# Patient Record
Sex: Female | Born: 1960 | Race: Black or African American | Hispanic: No | Marital: Married | Smoking: Never smoker
Health system: Southern US, Community
[De-identification: ages and names within clinical notes are randomized; demographics above are authoritative.]

## PROBLEM LIST (undated history)

## (undated) HISTORY — PX: ABDOMINAL HYSTERECTOMY: SHX81

## (undated) HISTORY — PX: CYST REMOVAL NECK: SHX6281

---

## 1999-07-11 ENCOUNTER — Other Ambulatory Visit: Admission: RE | Admit: 1999-07-11 | Discharge: 1999-07-11 | Payer: Self-pay | Admitting: Obstetrics and Gynecology

## 1999-07-20 ENCOUNTER — Ambulatory Visit (HOSPITAL_COMMUNITY): Admission: RE | Admit: 1999-07-20 | Discharge: 1999-07-20 | Payer: Self-pay | Admitting: Obstetrics and Gynecology

## 1999-07-20 ENCOUNTER — Encounter: Payer: Self-pay | Admitting: Obstetrics and Gynecology

## 2002-03-10 ENCOUNTER — Other Ambulatory Visit: Admission: RE | Admit: 2002-03-10 | Discharge: 2002-03-10 | Payer: Self-pay | Admitting: Family Medicine

## 2002-04-08 ENCOUNTER — Encounter: Payer: Self-pay | Admitting: Obstetrics

## 2002-04-08 ENCOUNTER — Ambulatory Visit (HOSPITAL_COMMUNITY): Admission: RE | Admit: 2002-04-08 | Discharge: 2002-04-08 | Payer: Self-pay | Admitting: Obstetrics

## 2002-10-15 ENCOUNTER — Inpatient Hospital Stay (HOSPITAL_COMMUNITY): Admission: AD | Admit: 2002-10-15 | Discharge: 2002-10-17 | Payer: Self-pay | Admitting: Obstetrics

## 2004-08-21 ENCOUNTER — Encounter: Admission: RE | Admit: 2004-08-21 | Discharge: 2004-08-21 | Payer: Self-pay | Admitting: Family Medicine

## 2004-11-02 ENCOUNTER — Ambulatory Visit (HOSPITAL_COMMUNITY): Admission: RE | Admit: 2004-11-02 | Discharge: 2004-11-03 | Payer: Self-pay | Admitting: Otolaryngology

## 2005-10-26 ENCOUNTER — Ambulatory Visit (HOSPITAL_COMMUNITY): Admission: RE | Admit: 2005-10-26 | Discharge: 2005-10-26 | Payer: Self-pay | Admitting: Family Medicine

## 2015-09-09 DIAGNOSIS — N951 Menopausal and female climacteric states: Secondary | ICD-10-CM | POA: Diagnosis not present

## 2015-09-09 DIAGNOSIS — R635 Abnormal weight gain: Secondary | ICD-10-CM | POA: Diagnosis not present

## 2015-09-15 DIAGNOSIS — E538 Deficiency of other specified B group vitamins: Secondary | ICD-10-CM | POA: Diagnosis not present

## 2015-09-15 DIAGNOSIS — E6609 Other obesity due to excess calories: Secondary | ICD-10-CM | POA: Diagnosis not present

## 2015-09-15 DIAGNOSIS — N958 Other specified menopausal and perimenopausal disorders: Secondary | ICD-10-CM | POA: Diagnosis not present

## 2015-09-15 DIAGNOSIS — R7303 Prediabetes: Secondary | ICD-10-CM | POA: Diagnosis not present

## 2015-09-15 DIAGNOSIS — E782 Mixed hyperlipidemia: Secondary | ICD-10-CM | POA: Diagnosis not present

## 2015-09-23 DIAGNOSIS — R7303 Prediabetes: Secondary | ICD-10-CM | POA: Diagnosis not present

## 2015-09-23 DIAGNOSIS — E6609 Other obesity due to excess calories: Secondary | ICD-10-CM | POA: Diagnosis not present

## 2015-09-23 DIAGNOSIS — E538 Deficiency of other specified B group vitamins: Secondary | ICD-10-CM | POA: Diagnosis not present

## 2015-09-23 DIAGNOSIS — E782 Mixed hyperlipidemia: Secondary | ICD-10-CM | POA: Diagnosis not present

## 2015-09-29 DIAGNOSIS — E6609 Other obesity due to excess calories: Secondary | ICD-10-CM | POA: Diagnosis not present

## 2015-09-29 DIAGNOSIS — E782 Mixed hyperlipidemia: Secondary | ICD-10-CM | POA: Diagnosis not present

## 2015-09-29 DIAGNOSIS — R7303 Prediabetes: Secondary | ICD-10-CM | POA: Diagnosis not present

## 2015-09-29 DIAGNOSIS — E538 Deficiency of other specified B group vitamins: Secondary | ICD-10-CM | POA: Diagnosis not present

## 2015-10-07 DIAGNOSIS — R7303 Prediabetes: Secondary | ICD-10-CM | POA: Diagnosis not present

## 2015-10-07 DIAGNOSIS — E6609 Other obesity due to excess calories: Secondary | ICD-10-CM | POA: Diagnosis not present

## 2015-10-07 DIAGNOSIS — E782 Mixed hyperlipidemia: Secondary | ICD-10-CM | POA: Diagnosis not present

## 2015-10-07 DIAGNOSIS — E538 Deficiency of other specified B group vitamins: Secondary | ICD-10-CM | POA: Diagnosis not present

## 2015-10-13 DIAGNOSIS — E6609 Other obesity due to excess calories: Secondary | ICD-10-CM | POA: Diagnosis not present

## 2015-10-13 DIAGNOSIS — E782 Mixed hyperlipidemia: Secondary | ICD-10-CM | POA: Diagnosis not present

## 2015-10-13 DIAGNOSIS — E538 Deficiency of other specified B group vitamins: Secondary | ICD-10-CM | POA: Diagnosis not present

## 2015-10-13 DIAGNOSIS — R7303 Prediabetes: Secondary | ICD-10-CM | POA: Diagnosis not present

## 2015-10-21 DIAGNOSIS — E538 Deficiency of other specified B group vitamins: Secondary | ICD-10-CM | POA: Diagnosis not present

## 2015-10-21 DIAGNOSIS — E782 Mixed hyperlipidemia: Secondary | ICD-10-CM | POA: Diagnosis not present

## 2015-10-21 DIAGNOSIS — E6609 Other obesity due to excess calories: Secondary | ICD-10-CM | POA: Diagnosis not present

## 2015-10-21 DIAGNOSIS — R7303 Prediabetes: Secondary | ICD-10-CM | POA: Diagnosis not present

## 2015-10-27 DIAGNOSIS — E782 Mixed hyperlipidemia: Secondary | ICD-10-CM | POA: Diagnosis not present

## 2015-10-27 DIAGNOSIS — E538 Deficiency of other specified B group vitamins: Secondary | ICD-10-CM | POA: Diagnosis not present

## 2015-10-27 DIAGNOSIS — R7303 Prediabetes: Secondary | ICD-10-CM | POA: Diagnosis not present

## 2015-10-27 DIAGNOSIS — E6609 Other obesity due to excess calories: Secondary | ICD-10-CM | POA: Diagnosis not present

## 2015-11-03 DIAGNOSIS — E782 Mixed hyperlipidemia: Secondary | ICD-10-CM | POA: Diagnosis not present

## 2015-11-03 DIAGNOSIS — E538 Deficiency of other specified B group vitamins: Secondary | ICD-10-CM | POA: Diagnosis not present

## 2015-11-03 DIAGNOSIS — R7303 Prediabetes: Secondary | ICD-10-CM | POA: Diagnosis not present

## 2015-11-03 DIAGNOSIS — E6609 Other obesity due to excess calories: Secondary | ICD-10-CM | POA: Diagnosis not present

## 2015-11-11 DIAGNOSIS — R7303 Prediabetes: Secondary | ICD-10-CM | POA: Diagnosis not present

## 2015-11-11 DIAGNOSIS — E782 Mixed hyperlipidemia: Secondary | ICD-10-CM | POA: Diagnosis not present

## 2015-11-11 DIAGNOSIS — E538 Deficiency of other specified B group vitamins: Secondary | ICD-10-CM | POA: Diagnosis not present

## 2015-11-11 DIAGNOSIS — E6609 Other obesity due to excess calories: Secondary | ICD-10-CM | POA: Diagnosis not present

## 2015-11-18 DIAGNOSIS — E782 Mixed hyperlipidemia: Secondary | ICD-10-CM | POA: Diagnosis not present

## 2015-11-18 DIAGNOSIS — E538 Deficiency of other specified B group vitamins: Secondary | ICD-10-CM | POA: Diagnosis not present

## 2015-11-18 DIAGNOSIS — E6609 Other obesity due to excess calories: Secondary | ICD-10-CM | POA: Diagnosis not present

## 2015-11-24 DIAGNOSIS — R7303 Prediabetes: Secondary | ICD-10-CM | POA: Diagnosis not present

## 2015-11-24 DIAGNOSIS — E6609 Other obesity due to excess calories: Secondary | ICD-10-CM | POA: Diagnosis not present

## 2015-11-24 DIAGNOSIS — E782 Mixed hyperlipidemia: Secondary | ICD-10-CM | POA: Diagnosis not present

## 2015-11-24 DIAGNOSIS — E538 Deficiency of other specified B group vitamins: Secondary | ICD-10-CM | POA: Diagnosis not present

## 2015-12-01 DIAGNOSIS — E6609 Other obesity due to excess calories: Secondary | ICD-10-CM | POA: Diagnosis not present

## 2015-12-01 DIAGNOSIS — E782 Mixed hyperlipidemia: Secondary | ICD-10-CM | POA: Diagnosis not present

## 2015-12-01 DIAGNOSIS — R7303 Prediabetes: Secondary | ICD-10-CM | POA: Diagnosis not present

## 2015-12-01 DIAGNOSIS — E538 Deficiency of other specified B group vitamins: Secondary | ICD-10-CM | POA: Diagnosis not present

## 2015-12-15 DIAGNOSIS — E538 Deficiency of other specified B group vitamins: Secondary | ICD-10-CM | POA: Diagnosis not present

## 2015-12-15 DIAGNOSIS — R7303 Prediabetes: Secondary | ICD-10-CM | POA: Diagnosis not present

## 2015-12-15 DIAGNOSIS — E6609 Other obesity due to excess calories: Secondary | ICD-10-CM | POA: Diagnosis not present

## 2015-12-15 DIAGNOSIS — E782 Mixed hyperlipidemia: Secondary | ICD-10-CM | POA: Diagnosis not present

## 2016-01-26 DIAGNOSIS — Z1231 Encounter for screening mammogram for malignant neoplasm of breast: Secondary | ICD-10-CM | POA: Diagnosis not present

## 2016-01-26 DIAGNOSIS — Z803 Family history of malignant neoplasm of breast: Secondary | ICD-10-CM | POA: Diagnosis not present

## 2016-06-06 DIAGNOSIS — Z0000 Encounter for general adult medical examination without abnormal findings: Secondary | ICD-10-CM | POA: Diagnosis not present

## 2016-06-06 DIAGNOSIS — E559 Vitamin D deficiency, unspecified: Secondary | ICD-10-CM | POA: Diagnosis not present

## 2016-06-11 DIAGNOSIS — Z0000 Encounter for general adult medical examination without abnormal findings: Secondary | ICD-10-CM | POA: Diagnosis not present

## 2016-06-11 DIAGNOSIS — E782 Mixed hyperlipidemia: Secondary | ICD-10-CM | POA: Diagnosis not present

## 2016-06-11 DIAGNOSIS — Z23 Encounter for immunization: Secondary | ICD-10-CM | POA: Diagnosis not present

## 2016-06-11 DIAGNOSIS — E669 Obesity, unspecified: Secondary | ICD-10-CM | POA: Diagnosis not present

## 2016-06-11 DIAGNOSIS — E559 Vitamin D deficiency, unspecified: Secondary | ICD-10-CM | POA: Diagnosis not present

## 2017-07-12 DIAGNOSIS — Z1231 Encounter for screening mammogram for malignant neoplasm of breast: Secondary | ICD-10-CM | POA: Diagnosis not present

## 2019-02-03 ENCOUNTER — Ambulatory Visit
Admission: EM | Admit: 2019-02-03 | Discharge: 2019-02-03 | Disposition: A | Discharge status: Home / Self Care | Payer: BC Managed Care – PPO | Attending: Physician Assistant | Admitting: Physician Assistant

## 2019-02-03 DIAGNOSIS — R1032 Left lower quadrant pain: Secondary | ICD-10-CM | POA: Diagnosis not present

## 2019-02-03 DIAGNOSIS — R109 Unspecified abdominal pain: Secondary | ICD-10-CM

## 2019-02-03 LAB — POCT URINALYSIS DIP (MANUAL ENTRY)
Bilirubin, UA: NEGATIVE
Blood, UA: NEGATIVE
Glucose, UA: NEGATIVE mg/dL
Leukocytes, UA: NEGATIVE
Nitrite, UA: NEGATIVE
Protein Ur, POC: NEGATIVE mg/dL
Spec Grav, UA: >=1.03 — AB (ref 1.010–1.025)
Urobilinogen, UA: 0.2 E.U./dL
pH, UA: 6 (ref 5.0–8.0)

## 2019-02-03 MED ORDER — ONDANSETRON 4 MG PO TBDP: 4.0000 mg | ORAL_TABLET | Freq: Three times a day (TID) | ORAL | 0 refills | Status: DC | PRN

## 2019-02-03 MED ORDER — TIZANIDINE HCL 4 MG PO TABS: 4.0000 mg | ORAL_TABLET | Freq: Three times a day (TID) | ORAL | 0 refills | Status: DC | PRN

## 2019-02-03 NOTE — ED Provider Notes (Signed)
[facility]    CSN: [id number] Arrival date & time: 02/03/19  1431      History   Chief Complaint Chief Complaint  Patient presents with  . Flank Pain    HPI Felicia Richard is a 59 y.o. female.   59 year old female with no significant past medical history comes in for intermittent flank pain for the past 4 months, with acute worsening for the past few days.  States flank pain is intermittent, to the left side, that is dull/aching in sensation.  Usually without any aggravating or alleviating factor, and resolves on own shortly.  Current episode started 4 days ago, with the left lower quadrant pain, left-sided pain, now radiating to left flank pain.  Again pain is dull and aching.  States this has been worse with movement.  Had nausea without vomiting.  States 4 days ago, had some diarrhea, but this has resolved.  Denies melena, hematochezia.  Denies fever, chills, body aches.  Denies urinary symptoms such as frequency, dysuria, hematuria.  Denies vaginal discharge, itching, spotting.  Status post partial hysterectomy, patient states ovaries still intact.  Last colonoscopy 10 years ago, without acute findings, no known diverticulosis.  Denies history of constipation.  Due for colonoscopy this year.     History reviewed. No pertinent past medical history.  There are no problems to display for this patient.   History reviewed. No pertinent surgical history.  OB History   No obstetric history on file.      Home Medications    Prior to Admission medications   Medication Sig Start Date End Date Taking? Authorizing Provider  ondansetron (ZOFRAN ODT) 4 MG disintegrating tablet Take 1 tablet (4 mg total) by mouth every 8 (eight) hours as needed for nausea or vomiting. 02/03/19   Tasia Catchings, Amy V, PA-C  tiZANidine (ZANAFLEX) 4 MG tablet Take 1 tablet (4 mg total) by mouth every 8 (eight) hours as needed for muscle spasms. 02/03/19   Ok Edwards, PA-C    Family History History reviewed.  No pertinent family history.  Social History Social History   Tobacco Use  . Smoking status: Never Smoker  . Smokeless tobacco: Never Used  Substance Use Topics  . Alcohol use: Not Currently  . Drug use: Not on file     Allergies   Sulfa antibiotics   Review of Systems Review of Systems  Reason unable to perform ROS: See HPI as above.     Physical Exam Triage Vital Signs ED Triage Vitals  Enc Vitals Group     BP 02/03/19 1516 119/76     Pulse Rate 02/03/19 1516 68     Resp 02/03/19 1516 18     Temp 02/03/19 1516 99 F (37.2 C)     Temp Source 02/03/19 1516 Oral     SpO2 02/03/19 1516 96 %     Weight --      Height --      Head Circumference --      Peak Flow --      Pain Score 02/03/19 1517 7     Pain Loc --      Pain Edu? --      Excl. in [place]? --    No data found.  Updated Vital Signs BP 119/76 (BP Location: Left Arm)   Pulse 68   Temp 99 F (37.2 C) (Oral)   Resp 18   SpO2 96%   Physical Exam Constitutional:      General: She  is not in acute distress.    Appearance: She is well-developed. She is not ill-appearing, toxic-appearing or diaphoretic.  HENT:     Head: Normocephalic and atraumatic.  Eyes:     Conjunctiva/sclera: Conjunctivae normal.     Pupils: Pupils are equal, round, and reactive to light.  Cardiovascular:     Rate and Rhythm: Normal rate and regular rhythm.     Heart sounds: Normal heart sounds. No murmur. No friction rub. No gallop.   Pulmonary:     Effort: Pulmonary effort is normal.     Breath sounds: Normal breath sounds. No wheezing or rales.  Abdominal:     General: Bowel sounds are normal.     Palpations: Abdomen is soft.     Tenderness: There is no abdominal tenderness. There is no right CVA tenderness, left CVA tenderness, guarding or rebound.     Comments: No rashes, swelling, contusion  Musculoskeletal:     Cervical back: Normal range of motion and neck supple.     Comments: No tenderness to palpation of back. No  rashes, swelling, contusion.   Skin:    General: Skin is warm and dry.  Neurological:     Mental Status: She is alert and oriented to person, place, and time.  Psychiatric:        Behavior: Behavior normal.        Judgment: Judgment normal.    UC Treatments / Results  Labs (all labs ordered are listed, but only abnormal results are displayed) Labs Reviewed  POCT URINALYSIS DIP (MANUAL ENTRY) - Abnormal; Notable for the following components:      Result Value   Ketones, POC UA trace (5) (*)    Spec Grav, UA >=1.030 (*)    All other components within normal limits    EKG   Radiology No results found.  Procedures Procedures (including critical care time)  Medications Ordered in UC Medications - No data to display  Initial Impression / Assessment and Plan / UC Course  I have reviewed the triage vital signs and the nursing notes.  Pertinent labs & imaging results that were available during my care of the patient were reviewed by me and considered in my medical decision making (see chart for details).  No alarming signs on exam.  However, no obvious etiology of left lower quadrant pain, left flank pain.  This was not reproducible on exam.  Urine without leukocytes or nitrites.  Trace ketones, which is not alarming with decreased appetite and nausea.  Negative CVA tenderness.  Low suspicion for pyelonephritis, cystitis, urethral stone.  Patient without reproducible left lower quadrant pain, diarrhea resolved, without fever/chills, no history of constipation, low suspicion for diverticulitis. ?  Changes in ovaries given intermittent pain, will have patient follow-up with OB/GYN/PCP for further evaluation needed.  Given current episode of pain is exacerbated by movement, will also provide tizanidine for possible muscular pain.  Zofran for symptomatic treatment.  Push fluids.  Return precautions given.  Patient expresses understanding and agrees to plan.  Final Clinical Impressions(s) /  UC Diagnoses   Final diagnoses:  LLQ pain  Left flank pain   ED Prescriptions    Medication Sig Dispense Auth. Provider   ondansetron (ZOFRAN ODT) 4 MG disintegrating tablet Take 1 tablet (4 mg total) by mouth every 8 (eight) hours as needed for nausea or vomiting. 15 tablet Yu, Amy V, PA-C   tiZANidine (ZANAFLEX) 4 MG tablet Take 1 tablet (4 mg total) by mouth every 8 (eight)  hours as needed for muscle spasms. 15 tablet Ok Edwards, PA-C     PDMP not reviewed this encounter.   Ok Edwards, PA-C 02/03/19 1648

## 2019-02-03 NOTE — ED Triage Notes (Signed)
Pt lt flank pain off and on since 09/2018. States worse today with mild nausea

## 2019-02-03 NOTE — Discharge Instructions (Signed)
No alarming signs on exam. Urine negative for infection/blood. Start zofran as needed for nausea/vomiting. Tizanidine for possible muscle pain. this can make you drowsy, so do not take if you are going to drive, operate heavy machinery, or make important decisions. Follow up with PCP if symptoms not improving. If having severe abdominal pain, nausea/vomiting no controlled by medication, fever, go to the ED for further evaluation needed.

## 2019-02-05 ENCOUNTER — Telehealth: Payer: Self-pay

## 2019-02-05 NOTE — Telephone Encounter (Signed)
Pt called asking for an appt for back pain LVM for pt to contact the office back to schedule an appt

## 2019-02-09 ENCOUNTER — Ambulatory Visit (INDEPENDENT_AMBULATORY_CARE_PROVIDER_SITE_OTHER): Payer: BC Managed Care – PPO | Admitting: Nurse Practitioner

## 2019-02-09 ENCOUNTER — Encounter: Payer: Self-pay | Admitting: Nurse Practitioner

## 2019-02-09 ENCOUNTER — Other Ambulatory Visit: Payer: Self-pay

## 2019-02-09 VITALS — BP 112/60 | HR 69 | Temp 98.0°F | Ht 65.0 in | Wt 173.4 lb

## 2019-02-09 DIAGNOSIS — R1032 Left lower quadrant pain: Secondary | ICD-10-CM | POA: Diagnosis not present

## 2019-02-09 DIAGNOSIS — R109 Unspecified abdominal pain: Secondary | ICD-10-CM

## 2019-02-09 DIAGNOSIS — Z1159 Encounter for screening for other viral diseases: Secondary | ICD-10-CM | POA: Diagnosis not present

## 2019-02-09 LAB — POCT URINALYSIS DIPSTICK
Bilirubin, UA: NEGATIVE
Blood, UA: NEGATIVE
Glucose, UA: NEGATIVE
Nitrite, UA: NEGATIVE
Protein, UA: NEGATIVE
Spec Grav, UA: 1.01 (ref 1.010–1.025)
Urobilinogen, UA: 0.2 E.U./dL
pH, UA: 5.5 (ref 5.0–8.0)

## 2019-02-09 MED ORDER — AMOXICILLIN 875 MG PO TABS: 875.0000 mg | ORAL_TABLET | Freq: Two times a day (BID) | ORAL | 0 refills | Status: DC

## 2019-02-09 MED ORDER — KETOROLAC TROMETHAMINE 30 MG/ML IJ SOLN
30.0000 mg | Freq: Once | INTRAMUSCULAR | Status: AC
  Administered 2019-02-09: 30 mg via INTRAMUSCULAR

## 2019-02-09 NOTE — Progress Notes (Signed)
This visit occurred during the SARS-CoV-2 public health emergency.  Safety protocols were in place, including screening questions prior to the visit, additional usage of staff PPE, and extensive cleaning of exam room while observing appropriate contact time as indicated for disinfecting solutions.  Subjective:     Patient ID: Felicia Richard , female    DOB: 08-10-1960 , 59 y.o.   MRN: [id number]   Chief Complaint  Patient presents with  . Abdominal Pain    patient has been having some lower abdominal pain and lower back pain for the past week    HPI  She had diarrhea almost 2 weeks ago on Saturday.  No other family members are sick.  Denies taking a probiotic regularly, she ran out one month ago. She takes vitamin d and emergency.  No previous history of covid. She is due for her colonoscopy last year.  Still has her gallbladder and appendix.  Abdominal Pain This is a new problem. The current episode started in the past 7 days (last week). The onset quality is sudden. The problem has been gradually worsening. The pain is located in the left flank. The pain is at a severity of 6/10. The quality of the pain is aching and dull. Associated symptoms include diarrhea and vomiting. Pertinent negatives include no constipation, dysuria, fever or nausea. The pain is relieved by sitting up. She has tried nothing for the symptoms. There is no history of abdominal surgery or colon cancer.     No past medical history on file.   No family history on file.   Current Outpatient Medications:  .  tiZANidine (ZANAFLEX) 4 MG tablet, Take 1 tablet (4 mg total) by mouth every 8 (eight) hours as needed for muscle spasms., Disp: 15 tablet, Rfl: 0 .  ondansetron (ZOFRAN ODT) 4 MG disintegrating tablet, Take 1 tablet (4 mg total) by mouth every 8 (eight) hours as needed for nausea or vomiting. (Patient not taking: Reported on 02/09/2019), Disp: 15 tablet, Rfl: 0   Allergies  Allergen Reactions  . Sulfa Antibiotics       Review of Systems  Constitutional: Negative for fever.  Respiratory: Negative.   Cardiovascular: Negative.   Gastrointestinal: Positive for abdominal pain, diarrhea and vomiting. Negative for constipation and nausea.  Genitourinary: Negative for dysuria.  Psychiatric/Behavioral: Negative.      Today's Vitals   02/09/19 1456  BP: 112/60  Pulse: 69  Temp: 98 F (36.7 C)  TempSrc: Oral  Weight: 173 lb 6.4 oz (78.7 kg)  Height: _0  (1.651 m)  PainSc: 6   PainLoc: Abdomen   Body mass index is 28.86 kg/m.   Objective:  Physical Exam Constitutional:      Appearance: She is well-developed.  Cardiovascular:     Rate and Rhythm: Normal rate and regular rhythm.     Heart sounds: Normal heart sounds. No murmur.  Pulmonary:     Effort: Pulmonary effort is normal.     Breath sounds: Normal breath sounds.  Abdominal:     General: Bowel sounds are normal.     Palpations: Abdomen is soft. There is no hepatomegaly or mass.     Tenderness: There is generalized abdominal tenderness. There is no guarding. Negative signs include Murphy's sign.  Skin:    General: Skin is warm and dry.  Neurological:     General: No focal deficit present.     Mental Status: She is alert and oriented to person, place, and time.  Psychiatric:  Mood and Affect: Mood normal.        Behavior: Behavior normal.         Assessment And Plan:     1. Flank pain  Urinalysis is normal - POCT Urinalysis Dipstick (81002) - ketorolac (TORADOL) 30 MG/ML injection 30 mg - amoxicillin (AMOXIL) 875 MG tablet; Take 1 tablet (875 mg total) by mouth 2 (two) times daily.  Dispense: 14 tablet; Refill: 0  2. Left lower quadrant abdominal pain  Due to the persistent discomfort will order CT scan of abdomen, concerned about possible diverticulitis  Will treat with antibiotics as precaution due to the length of time - CBC without diff - CMP14+EGFR - ketorolac (TORADOL) 30 MG/ML injection 30 mg - CT  Abdomen Pelvis Wo Contrast; Future - amoxicillin (AMOXIL) 875 MG tablet; Take 1 tablet (875 mg total) by mouth 2 (two) times daily.  Dispense: 14 tablet; Refill: 0 - traMADol (ULTRAM) 50 MG tablet; Take 1 tablet (50 mg total) by mouth every 6 (six) hours as needed.  Dispense: 20 tablet; Refill: 0  3. Encounter for hepatitis C screening test for low risk patient  Will check for Hepatitis C screening due to being born between the years 1945-1965 - Hepatitis C antibody   Minette Brine, FNP    THE PATIENT IS ENCOURAGED TO PRACTICE SOCIAL DISTANCING DUE TO THE COVID-19 PANDEMIC.

## 2019-02-09 NOTE — Patient Instructions (Signed)

## 2019-02-10 ENCOUNTER — Telehealth: Payer: Self-pay

## 2019-02-10 LAB — HEPATITIS C ANTIBODY: Hep C Virus Ab: <0.1 s/co ratio (ref 0.0–0.9)

## 2019-02-10 LAB — CMP14+EGFR
ALT: 13 IU/L (ref 0–32)
AST: 21 IU/L (ref 0–40)
Albumin/Globulin Ratio: 1.7 (ref 1.2–2.2)
Albumin: 4.3 g/dL (ref 3.8–4.9)
Alkaline Phosphatase: 47 IU/L (ref 39–117)
BUN/Creatinine Ratio: 9 — ABNORMAL LOW (ref 12–28)
BUN: 8 mg/dL (ref 8–27)
Bilirubin Total: 0.3 mg/dL (ref 0.0–1.2)
CO2: 24 mmol/L (ref 20–29)
Calcium: 9.1 mg/dL (ref 8.7–10.3)
Chloride: 102 mmol/L (ref 96–106)
Creatinine, Ser: 0.85 mg/dL (ref 0.57–1.00)
GFR calc Af Amer: 86 mL/min/{1.73_m2} (ref >59)
GFR calc non Af Amer: 75 mL/min/{1.73_m2} (ref >59)
Globulin, Total: 2.6 g/dL (ref 1.5–4.5)
Glucose: 103 mg/dL — ABNORMAL HIGH (ref 65–99)
Potassium: 4 mmol/L (ref 3.5–5.2)
Sodium: 140 mmol/L (ref 134–144)
Total Protein: 6.9 g/dL (ref 6.0–8.5)

## 2019-02-10 LAB — CBC
Hematocrit: 36.7 % (ref 34.0–46.6)
Hemoglobin: 12.1 g/dL (ref 11.1–15.9)
MCH: 29.7 pg (ref 26.6–33.0)
MCHC: 33 g/dL (ref 31.5–35.7)
MCV: 90 fL (ref 79–97)
Platelets: 215 10*3/uL (ref 150–450)
RBC: 4.08 x10E6/uL (ref 3.77–5.28)
RDW: 14.2 % (ref 11.7–15.4)
WBC: 3.9 10*3/uL (ref 3.4–10.8)

## 2019-02-10 MED ORDER — TRAMADOL HCL 50 MG PO TABS: 50.0000 mg | ORAL_TABLET | Freq: Four times a day (QID) | ORAL | 0 refills | Status: DC | PRN

## 2019-02-10 NOTE — Telephone Encounter (Signed)
Spoke w/pt:  call [facility] and tell her we are waiting for a reponse from her ins on the CT scan and we can send in tramadol for the pain but it is a narcotic Pt said ok she would like the tramadol

## 2019-02-15 ENCOUNTER — Encounter: Payer: Self-pay | Admitting: Nurse Practitioner

## 2019-02-16 ENCOUNTER — Encounter: Payer: Self-pay | Admitting: Nurse Practitioner

## 2019-02-16 ENCOUNTER — Ambulatory Visit (INDEPENDENT_AMBULATORY_CARE_PROVIDER_SITE_OTHER): Payer: BC Managed Care – PPO | Admitting: Nurse Practitioner

## 2019-02-16 ENCOUNTER — Other Ambulatory Visit: Payer: Self-pay

## 2019-02-16 VITALS — BP 122/78 | HR 98 | Temp 98.4°F | Ht 64.4 in | Wt 171.0 lb

## 2019-02-16 DIAGNOSIS — Z0000 Encounter for general adult medical examination without abnormal findings: Secondary | ICD-10-CM | POA: Diagnosis not present

## 2019-02-16 DIAGNOSIS — Z13228 Encounter for screening for other metabolic disorders: Secondary | ICD-10-CM | POA: Diagnosis not present

## 2019-02-16 DIAGNOSIS — R1032 Left lower quadrant pain: Secondary | ICD-10-CM

## 2019-02-16 NOTE — Progress Notes (Signed)
This visit occurred during the SARS-CoV-2 public health emergency.  Safety protocols were in place, including screening questions prior to the visit, additional usage of staff PPE, and extensive cleaning of exam room while observing appropriate contact time as indicated for disinfecting solutions.  Subjective:     Patient ID: Felicia Richard , female    DOB: 12-13-1960 , 59 y.o.   MRN: [id number]   Chief Complaint  Patient presents with  . Annual Exam    HPI  Here for HM.    She has been taking tramadol as needed.  She drinks adequate amounts of water daily.  She eats vegetables and fruit daily.  Abdominal Pain The pain is located in the LLQ and LUQ. The quality of the pain is aching. Pertinent negatives include no anorexia, constipation, fever, flatus or headaches.    The patient statesestatus post hysterectomy for birth control.  Mammogram last done two years ago at [place].  Negative for: breast discharge, breast lump(s), breast pain and breast self exam.  Pertinent negatives include abnormal bleeding (hematology), anxiety, decreased libido, depression, difficulty falling sleep, dyspareunia, history of infertility, nocturia, sexual dysfunction, sleep disturbances, urinary incontinence, urinary urgency, vaginal discharge and vaginal itching. Diet regular. The patient states her exercise level is moderate 3-4 days a week at least one hour.  Saturday she power walks for about 2 hours.      The patient's tobacco use is:  Social History   Tobacco Use  Smoking Status Never Smoker  Smokeless Tobacco Never Used   She has been exposed to passive smoke. The patient's alcohol use is:  Social History   Substance and Sexual Activity  Alcohol Use Not Currently   Additional information: Has had a hysterectomy.  No past medical history on file.   No family history on file.   Current Outpatient Medications:  .  traMADol (ULTRAM) 50 MG tablet, Take 1 tablet (50 mg total) by mouth every 6 (six)  hours as needed., Disp: 20 tablet, Rfl: 0 .  tiZANidine (ZANAFLEX) 4 MG tablet, Take 1 tablet (4 mg total) by mouth every 8 (eight) hours as needed for muscle spasms. (Patient not taking: Reported on 02/16/2019), Disp: 15 tablet, Rfl: 0   Allergies  Allergen Reactions  . Sulfa Antibiotics      Review of Systems  Constitutional: Negative.  Negative for chills, fatigue and fever.  HENT: Negative.   Eyes: Negative.   Respiratory: Negative.   Cardiovascular: Negative.  Negative for chest pain, palpitations and leg swelling.  Gastrointestinal: Positive for abdominal pain (left side upper and lower abdomen). Negative for abdominal distention, anorexia, constipation and flatus.  Endocrine: Negative.   Genitourinary: Negative.   Musculoskeletal: Negative.   Skin: Negative.   Allergic/Immunologic: Negative.   Neurological: Negative.  Negative for dizziness and headaches.  Hematological: Negative.   Psychiatric/Behavioral: Negative.      Today's Vitals   02/16/19 1509  BP: 122/78  Pulse: 98  Temp: 98.4 F (36.9 C)  TempSrc: Oral  Weight: 171 lb (77.6 kg)  Height: 5' 4.4" (1.636 m)  PainSc: 2   PainLoc: Back   Body mass index is 28.99 kg/m.   Objective:  Physical Exam Constitutional:      General: She is not in acute distress.    Appearance: Normal appearance.  HENT:     Head: Normocephalic.     Right Ear: Tympanic membrane normal.     Left Ear: Tympanic membrane normal.  Cardiovascular:     Rate and Rhythm:  Normal rate and regular rhythm.     Pulses: Normal pulses.     Heart sounds: Normal heart sounds. No murmur.  Pulmonary:     Effort: Pulmonary effort is normal. No respiratory distress.     Breath sounds: Normal breath sounds.  Skin:    General: Skin is warm and dry.     Capillary Refill: Capillary refill takes less than 2 seconds.  Neurological:     General: No focal deficit present.     Mental Status: She is alert and oriented to person, place, and time.          Assessment And Plan:   1. Health maintenance examination . Behavior modifications discussed and diet history reviewed.   . Pt will continue to exercise regularly and modify diet with low GI, plant based foods and decrease intake of processed foods.  . Recommend intake of daily multivitamin, Vitamin D, and calcium.  . Recommend mammogram and colonoscopy for preventive screenings, as well as recommend immunizations that include influenza, TDAP  2. Left lower quadrant abdominal pain - will check for H pylori as well as she awaits her CT scan - Lipase - Amylase - H Pylori, IGM, IGG, IGA AB  3. Encounter for screening for metabolic disorder  - Lipid panel   Minette Brine, FNP    THE PATIENT IS ENCOURAGED TO PRACTICE SOCIAL DISTANCING DUE TO THE COVID-19 PANDEMIC.

## 2019-02-17 LAB — H PYLORI, IGM, IGG, IGA AB
H pylori, IgM Abs: <9 units (ref 0.0–8.9)
H. pylori, IgA Abs: <9 units (ref 0.0–8.9)
H. pylori, IgG AbS: 0.84 Index Value — ABNORMAL HIGH (ref 0.00–0.79)

## 2019-02-17 LAB — LIPID PANEL
Chol/HDL Ratio: 2.9 ratio (ref 0.0–4.4)
Cholesterol, Total: 206 mg/dL — ABNORMAL HIGH (ref 100–199)
HDL: 70 mg/dL (ref >39)
LDL Chol Calc (NIH): 122 mg/dL — ABNORMAL HIGH (ref 0–99)
Triglycerides: 78 mg/dL (ref 0–149)
VLDL Cholesterol Cal: 14 mg/dL (ref 5–40)

## 2019-02-17 LAB — LIPASE: Lipase: 47 U/L (ref 14–72)

## 2019-02-17 LAB — AMYLASE: Amylase: 156 U/L — ABNORMAL HIGH (ref 31–110)

## 2019-02-24 NOTE — Patient Instructions (Signed)
Health Maintenance, Female Adopting a healthy lifestyle and getting preventive care are important in promoting health and wellness. Ask your health care provider about:  The right schedule for you to have regular tests and exams.  Things you can do on your own to prevent diseases and keep yourself healthy. What should I know about diet, weight, and exercise? Eat a healthy diet   Eat a diet that includes plenty of vegetables, fruits, low-fat dairy products, and lean protein.  Do not eat a lot of foods that are high in solid fats, added sugars, or sodium. Maintain a healthy weight Body mass index (BMI) is used to identify weight problems. It estimates body fat based on height and weight. Your health care provider can help determine your BMI and help you achieve or maintain a healthy weight. Get regular exercise Get regular exercise. This is one of the most important things you can do for your health. Most adults should:  Exercise for at least 150 minutes each week. The exercise should increase your heart rate and make you sweat (moderate-intensity exercise).  Do strengthening exercises at least twice a week. This is in addition to the moderate-intensity exercise.  Spend less time sitting. Even light physical activity can be beneficial. Watch cholesterol and blood lipids Have your blood tested for lipids and cholesterol at 61 years of age, then have this test every 5 years. Have your cholesterol levels checked more often if:  Your lipid or cholesterol levels are high.  You are older than 61 years of age.  You are at high risk for heart disease. What should I know about cancer screening? Depending on your health history and family history, you may need to have cancer screening at various ages. This may include screening for:  Breast cancer.  Cervical cancer.  Colorectal cancer.  Skin cancer.  Lung cancer. What should I know about heart disease, diabetes, and high blood  pressure? Blood pressure and heart disease  High blood pressure causes heart disease and increases the risk of stroke. This is more likely to develop in people who have high blood pressure readings, are of African descent, or are overweight.  Have your blood pressure checked: ? Every 3-5 years if you are 18-39 years of age. ? Every year if you are 40 years old or older. Diabetes Have regular diabetes screenings. This checks your fasting blood sugar level. Have the screening done:  Once every three years after age 40 if you are at a normal weight and have a low risk for diabetes.  More often and at a younger age if you are overweight or have a high risk for diabetes. What should I know about preventing infection? Hepatitis B If you have a higher risk for hepatitis B, you should be screened for this virus. Talk with your health care provider to find out if you are at risk for hepatitis B infection. Hepatitis C Testing is recommended for:  Everyone born from 1945 through 1965.  Anyone with known risk factors for hepatitis C. Sexually transmitted infections (STIs)  Get screened for STIs, including gonorrhea and chlamydia, if: ? You are sexually active and are younger than 61 years of age. ? You are older than 61 years of age and your health care provider tells you that you are at risk for this type of infection. ? Your sexual activity has changed since you were last screened, and you are at increased risk for chlamydia or gonorrhea. Ask your health care provider if   you are at risk.  Ask your health care provider about whether you are at high risk for HIV. Your health care provider may recommend a prescription medicine to help prevent HIV infection. If you choose to take medicine to prevent HIV, you should first get tested for HIV. You should then be tested every 3 months for as long as you are taking the medicine. Pregnancy  If you are about to stop having your period (premenopausal) and  you may become pregnant, seek counseling before you get pregnant.  Take 400 to 800 micrograms (mcg) of folic acid every day if you become pregnant.  Ask for birth control (contraception) if you want to prevent pregnancy. Osteoporosis and menopause Osteoporosis is a disease in which the bones lose minerals and strength with aging. This can result in bone fractures. If you are 65 years old or older, or if you are at risk for osteoporosis and fractures, ask your health care provider if you should:  Be screened for bone loss.  Take a calcium or vitamin D supplement to lower your risk of fractures.  Be given hormone replacement therapy (HRT) to treat symptoms of menopause. Follow these instructions at home: Lifestyle  Do not use any products that contain nicotine or tobacco, such as cigarettes, e-cigarettes, and chewing tobacco. If you need help quitting, ask your health care provider.  Do not use street drugs.  Do not share needles.  Ask your health care provider for help if you need support or information about quitting drugs. Alcohol use  Do not drink alcohol if: ? Your health care provider tells you not to drink. ? You are pregnant, may be pregnant, or are planning to become pregnant.  If you drink alcohol: ? Limit how much you use to 0-1 drink a day. ? Limit intake if you are breastfeeding.  Be aware of how much alcohol is in your drink. In the [place] one drink equals one 12 oz bottle of beer (355 mL), one 5 oz glass of wine (148 mL), or one 1 oz glass of hard liquor (44 mL). General instructions  Schedule regular health, dental, and eye exams.  Stay current with your vaccines.  Tell your health care provider if: ? You often feel depressed. ? You have ever been abused or do not feel safe at home. Summary  Adopting a healthy lifestyle and getting preventive care are important in promoting health and wellness.  Follow your health care provider's instructions about healthy  diet, exercising, and getting tested or screened for diseases.  Follow your health care provider's instructions on monitoring your cholesterol and blood pressure. This information is not intended to replace advice given to you by your health care provider. Make sure you discuss any questions you have with your health care provider. Document Revised: 01/08/2018 Document Reviewed: 01/08/2018 Elsevier Patient Education  2020 [employer]  

## 2019-04-13 ENCOUNTER — Ambulatory Visit
Admission: RE | Admit: 2019-04-13 | Discharge: 2019-04-13 | Disposition: A | Discharge status: Home / Self Care | Payer: BC Managed Care – PPO | Source: Ambulatory Visit | Attending: Nurse Practitioner | Admitting: Nurse Practitioner

## 2019-04-13 ENCOUNTER — Other Ambulatory Visit: Payer: Self-pay | Admitting: Nurse Practitioner

## 2019-04-13 DIAGNOSIS — R1032 Left lower quadrant pain: Secondary | ICD-10-CM

## 2019-04-14 ENCOUNTER — Telehealth: Payer: Self-pay

## 2019-04-14 NOTE — Telephone Encounter (Signed)
Patient called wanting to know what are her next steps since she got her CT scan. She was advised that a referral has been placed to a GI provider and they should contact her within a couple of days. YL,RMA

## 2019-04-16 ENCOUNTER — Encounter: Payer: Self-pay | Admitting: Gastroenterology

## 2019-04-22 ENCOUNTER — Telehealth: Payer: Self-pay

## 2019-04-22 ENCOUNTER — Other Ambulatory Visit: Payer: Self-pay | Admitting: Nurse Practitioner

## 2019-04-22 DIAGNOSIS — R1032 Left lower quadrant pain: Secondary | ICD-10-CM

## 2019-04-22 MED ORDER — TRAMADOL HCL 50 MG PO TABS: 50.0000 mg | ORAL_TABLET | Freq: Four times a day (QID) | ORAL | 0 refills | Status: DC | PRN

## 2019-04-22 NOTE — Telephone Encounter (Signed)
Informed pt prescription Tramadol has been sent to the pharmacy

## 2019-05-20 ENCOUNTER — Ambulatory Visit (INDEPENDENT_AMBULATORY_CARE_PROVIDER_SITE_OTHER): Payer: BC Managed Care – PPO | Admitting: Gastroenterology

## 2019-05-20 ENCOUNTER — Encounter: Payer: Self-pay | Admitting: Gastroenterology

## 2019-05-20 VITALS — BP 120/62 | HR 78 | Temp 97.4°F | Ht 64.5 in | Wt 175.1 lb

## 2019-05-20 DIAGNOSIS — K5900 Constipation, unspecified: Secondary | ICD-10-CM | POA: Diagnosis not present

## 2019-05-20 DIAGNOSIS — Z1211 Encounter for screening for malignant neoplasm of colon: Secondary | ICD-10-CM

## 2019-05-20 DIAGNOSIS — R1032 Left lower quadrant pain: Secondary | ICD-10-CM | POA: Diagnosis not present

## 2019-05-20 MED ORDER — SUTAB 1479-225-188 MG PO TABS: ORAL_TABLET | ORAL | 0 refills | Status: DC

## 2019-05-20 NOTE — Progress Notes (Signed)
Felicia Richard    [id number]    [birth year]  Primary Care Physician:Moore, Doreene Burke, [place]  Referring Physician: Minette Brine, [address] [place],  [place] [address]   Chief complaint:  LLQ Abd pain  HPI:  59 year old female here for new patient visit with complaints of left lower quadrant pain radiating to the back, pain started on January 2 acutely.  She was mopping the floors the day before but denies any other injury.  Pain is worse when she turns or twists her body.  Denies any recent change in bowel habits, melena, blood per rectum, nausea or vomiting.  CT abd & pelvis with out contrast 04/13/19 22mm nodule in the right costophrenic sulcus Liver cysts, benign appearing.  No evidence of diverticulitis  Last colonoscopy with Dr. Collene Mares almost 10 years ago.  No family history of colon cancer.  No loss of appetite or unintentional weight loss.  Outpatient Encounter Medications as of 05/20/2019  Medication Sig  . tiZANidine (ZANAFLEX) 4 MG tablet Take 1 tablet (4 mg total) by mouth every 8 (eight) hours as needed for muscle spasms.  . traMADol (ULTRAM) 50 MG tablet Take 1 tablet (50 mg total) by mouth every 6 (six) hours as needed.   No facility-administered encounter medications on file as of 05/20/2019.    Allergies as of 05/20/2019 - Review Complete 05/20/2019  Allergen Reaction Noted  . Sulfa antibiotics Rash 02/03/2019    History reviewed. No pertinent past medical history.  Past Surgical History:  Procedure Laterality Date  . ABDOMINAL HYSTERECTOMY    . CYST REMOVAL NECK     removed from lymph node    Family History  Problem Relation Age of Onset  . Pulmonary embolism Mother   . Alzheimer's disease Father   . Diabetes Maternal Uncle   . Breast cancer Maternal Grandmother   . Diabetes Maternal Grandmother   . Diabetes Cousin     Social History   Socioeconomic History  . Marital status: Married    Spouse name: Not on file  .  Number of children: Not on file  . Years of education: Not on file  . Highest education level: Not on file  Occupational History  . Not on file  Tobacco Use  . Smoking status: Never Smoker  . Smokeless tobacco: Never Used  Substance and Sexual Activity  . Alcohol use: Not Currently  . Drug use: Never  . Sexual activity: Not on file  Other Topics Concern  . Not on file  Social History Narrative  . Not on file   Social Determinants of Health   Financial Resource Strain:   . Difficulty of Paying Living Expenses:   Food Insecurity:   . Worried About Charity fundraiser in the Last Year:   . Arboriculturist in the Last Year:   Transportation Needs:   . Film/video editor (Medical):   Marland Kitchen Lack of Transportation (Non-Medical):   Physical Activity:   . Days of Exercise per Week:   . Minutes of Exercise per Session:   Stress:   . Feeling of Stress :   Social Connections:   . Frequency of Communication with Friends and Family:   . Frequency of Social Gatherings with Friends and Family:   . Attends Religious Services:   . Active Member of Clubs or Organizations:   . Attends Archivist Meetings:   Marland Kitchen Marital Status:   Intimate  Partner Violence:   . Fear of Current or Ex-Partner:   . Emotionally Abused:   Marland Kitchen Physically Abused:   . Sexually Abused:       Review of systems:  All other review of systems negative except as mentioned in the HPI.   Physical Exam: Vitals:   05/20/19 1336  BP: 120/62  Pulse: 78  Temp: (!) 97.4 F (36.3 C)  SpO2: 97%   Body mass index is 29.6 kg/m. Gen:      No acute distress Abd:       soft, non-tender; no palpable masses, no distension Neuro: alert and oriented x 3 Psych: normal mood and affect  Data Reviewed:  Reviewed labs, radiology imaging, old records and pertinent past GI work up   Assessment and Plan/Recommendations:  59 year old very pleasant female here with complaints of left lower quadrant abdominal  pain  No evidence of diverticulitis on recent CT abdomen pelvis  She is due for colorectal cancer screening, will proceed with colonoscopy for further evaluation of the left lower quadrant abdominal pain  The risks and benefits as well as alternatives of endoscopic procedure(s) have been discussed and reviewed. All questions answered. The patient agrees to proceed.  Chronic idiopathic constipation: Intermittent Increase dietary fiber and fluid intake Start MiraLAX 1 capful daily.  If continues to have persistent constipation, will consider additional laxatives or will need to evaluate for possible dyssynergic defecation.    The patient was provided an opportunity to ask questions and all were answered. The patient agreed with the plan and demonstrated an understanding of the instructions.  Damaris Hippo , MD    CC: Minette Brine, FNP

## 2019-05-20 NOTE — Patient Instructions (Signed)
If you are age 61 or older, your body mass index should be between 23-30. Your Body mass index is 29.6 kg/m. If this is out of the aforementioned range listed, please consider follow up with your Primary Care Provider.  If you are age 24 or younger, your body mass index should be between 19-25. Your Body mass index is 29.6 kg/m. If this is out of the aformentioned range listed, please consider follow up with your Primary Care Provider.   You have been scheduled for a colonoscopy. Please follow written instructions given to you at your visit today.  Please pick up your prep supplies at the pharmacy within the next 1-3 days. If you use inhalers (even only as needed), please bring them with you on the day of your procedure.  Please purchase the following medications over the counter and take as directed:  START: Miralax 1 capful daily.  I appreciate the opportunity to care for you. Thank you for choosing me and [facility],  Dr. Harl Bowie

## 2019-05-22 ENCOUNTER — Encounter: Payer: Self-pay | Admitting: Gastroenterology

## 2019-05-25 ENCOUNTER — Telehealth: Payer: Self-pay | Admitting: Gastroenterology

## 2019-05-25 NOTE — Telephone Encounter (Signed)
Called pharmacy. Ask them to run coupon code that was attached to rx. Cost to pt is only 40.00 dollars. Pharmacy will order today and have ready for pt tomorrow. Pt has been informed.

## 2019-06-08 ENCOUNTER — Encounter: Payer: Self-pay | Admitting: Gastroenterology

## 2019-06-18 ENCOUNTER — Ambulatory Visit (AMBULATORY_SURGERY_CENTER): Payer: BC Managed Care – PPO | Admitting: Gastroenterology

## 2019-06-18 ENCOUNTER — Encounter: Payer: Self-pay | Admitting: Gastroenterology

## 2019-06-18 ENCOUNTER — Other Ambulatory Visit: Payer: Self-pay

## 2019-06-18 VITALS — BP 116/71 | HR 57 | Resp 16 | Ht 64.0 in | Wt 175.0 lb

## 2019-06-18 DIAGNOSIS — R1032 Left lower quadrant pain: Secondary | ICD-10-CM | POA: Diagnosis not present

## 2019-06-18 DIAGNOSIS — K649 Unspecified hemorrhoids: Secondary | ICD-10-CM

## 2019-06-18 DIAGNOSIS — K5730 Diverticulosis of large intestine without perforation or abscess without bleeding: Secondary | ICD-10-CM

## 2019-06-18 DIAGNOSIS — D123 Benign neoplasm of transverse colon: Secondary | ICD-10-CM

## 2019-06-18 MED ORDER — SODIUM CHLORIDE 0.9 % IV SOLN: 500.0000 mL | Freq: Once | INTRAVENOUS | Status: DC

## 2019-06-18 NOTE — Progress Notes (Signed)
Called to room to assist during endoscopic procedure.  Patient ID and intended procedure confirmed with present staff. Received instructions for my participation in the procedure from the performing physician.  

## 2019-06-18 NOTE — Op Note (Signed)
[facility] Patient Name: Felicia Richard Procedure Date: 06/18/2019 3:34 PM MRN: [id number] Endoscopist: Mauri Pole , MD Age: 59 Referring MD:  Date of Birth: 07-Jun-1960 Gender: Female Account #: [id number] Procedure:                Colonoscopy Indications:              Last colonoscopy 10 years ago, Abdominal pain in                            the left lower quadrant Medicines:                Monitored Anesthesia Care Procedure:                Pre-Anesthesia Assessment:                           - Prior to the procedure, a History and Physical                            was performed, and patient medications and                            allergies were reviewed. The patient's tolerance of                            previous anesthesia was also reviewed. The risks                            and benefits of the procedure and the sedation                            options and risks were discussed with the patient.                            All questions were answered, and informed consent                            was obtained. Prior Anticoagulants: The patient has                            taken no previous anticoagulant or antiplatelet                            agents. ASA Grade Assessment: II - A patient with                            mild systemic disease. After reviewing the risks                            and benefits, the patient was deemed in                            satisfactory condition to undergo the procedure.  After obtaining informed consent, the colonoscope                            was passed under direct vision. Throughout the                            procedure, the patient's blood pressure, pulse, and                            oxygen saturations were monitored continuously. The                            Colonoscope was introduced through the anus and                            advanced to the the cecum, identified  by                            appendiceal orifice and ileocecal valve. The                            colonoscopy was performed without difficulty. The                            patient tolerated the procedure well. The quality                            of the bowel preparation was excellent. The                            ileocecal valve, appendiceal orifice, and rectum                            were photographed. Scope In: 3:35:17 PM Scope Out: 3:49:25 PM Scope Withdrawal Time: 0 hours 8 minutes 35 seconds  Total Procedure Duration: 0 hours 14 minutes 8 seconds  Findings:                 The perianal and digital rectal examinations were                            normal.                           A less than 1 mm polyp was found in the transverse                            colon. The polyp was sessile. The polyp was removed                            with a cold biopsy forceps. Resection and retrieval                            were complete.  A few small-mouthed diverticula were found in the                            sigmoid colon and descending colon.                           Non-bleeding internal hemorrhoids were found during                            retroflexion. The hemorrhoids were small. Complications:            No immediate complications. Estimated Blood Loss:     Estimated blood loss was minimal. Impression:               - One less than 1 mm polyp in the transverse colon,                            removed with a cold biopsy forceps. Resected and                            retrieved.                           - Diverticulosis in the sigmoid colon and in the                            descending colon.                           - Non-bleeding internal hemorrhoids. Recommendation:           - Patient has a contact number available for                            emergencies. The signs and symptoms of potential                            delayed  complications were discussed with the                            patient. Return to normal activities tomorrow.                            Written discharge instructions were provided to the                            patient.                           - Resume previous diet.                           - Continue present medications.                           - Await pathology results.                           -  Repeat colonoscopy in 5-10 years for surveillance                            based on pathology results. Mauri Pole, MD 06/18/2019 3:55:07 PM This report has been signed electronically.

## 2019-06-18 NOTE — Patient Instructions (Signed)
HANDOUTS PROVIDED ON: POLYPS, DIVERTICULOSIS, & HEMORRHOIDS  The polyps removed today have been sent for pathology.  The results can take 1-3 weeks to receive.  When your next colonoscopy should occur will be based on the pathology results.    You may resume your previous diet and medication schedule.  Thank you for allowing us to care for you today!!!   YOU HAD AN ENDOSCOPIC PROCEDURE TODAY AT THE [facility]:   Refer to the procedure report that was given to you for any specific questions about what was found during the examination.  If the procedure report does not answer your questions, please call your gastroenterologist to clarify.  If you requested that your care partner not be given the details of your procedure findings, then the procedure report has been included in a sealed envelope for you to review at your convenience later.  YOU SHOULD EXPECT: Some feelings of bloating in the abdomen. Passage of more gas than usual.  Walking can help get rid of the air that was put into your GI tract during the procedure and reduce the bloating. If you had a lower endoscopy (such as a colonoscopy or flexible sigmoidoscopy) you may notice spotting of blood in your stool or on the toilet paper. If you underwent a bowel prep for your procedure, you may not have a normal bowel movement for a few days.  Please Note:  You might notice some irritation and congestion in your nose or some drainage.  This is from the oxygen used during your procedure.  There is no need for concern and it should clear up in a day or so.  SYMPTOMS TO REPORT IMMEDIATELY:   Following lower endoscopy (colonoscopy or flexible sigmoidoscopy):  Excessive amounts of blood in the stool  Significant tenderness or worsening of abdominal pains  Swelling of the abdomen that is new, acute  Fever of 100F or higher  For urgent or emergent issues, a gastroenterologist can be reached at any hour by calling [phone]. Do  not use MyChart messaging for urgent concerns.    DIET:  We do recommend a small meal at first, but then you may proceed to your regular diet.  Drink plenty of fluids but you should avoid alcoholic beverages for 24 hours.  ACTIVITY:  You should plan to take it easy for the rest of today and you should NOT DRIVE or use heavy machinery until tomorrow (because of the sedation medicines used during the test).    FOLLOW UP: Our staff will call the number listed on your records 48-72 hours following your procedure to check on you and address any questions or concerns that you may have regarding the information given to you following your procedure. If we do not reach you, we will leave a message.  We will attempt to reach you two times.  During this call, we will ask if you have developed any symptoms of COVID 19. If you develop any symptoms (ie: fever, flu-like symptoms, shortness of breath, cough etc.) before then, please call [phone].  If you test positive for Covid 19 in the 2 weeks post procedure, please call and report this information to us.    If any biopsies were taken you will be contacted by phone or by letter within the next 1-3 weeks.  Please call us at [phone] if you have not heard about the biopsies in 3 weeks.    SIGNATURES/CONFIDENTIALITY: You and/or your care partner have signed paperwork which will   be entered into your electronic medical record.  These signatures attest to the fact that that the information above on your After Visit Summary has been reviewed and is understood.  Full responsibility of the confidentiality of this discharge information lies with you and/or your care-partner. 

## 2019-06-18 NOTE — Progress Notes (Signed)
1555 Pt experienced N/V with Roninal and Zofran per Dr Silverio Decamp request, vs    Report given to PACU, vss

## 2019-06-22 ENCOUNTER — Telehealth: Payer: Self-pay | Admitting: *Deleted

## 2019-06-22 NOTE — Telephone Encounter (Signed)
  Follow up Call-  Call back number 06/18/2019  Post procedure Call Back phone  # [phone]  Permission to leave phone message Yes  Some recent data might be hidden     Patient questions:  Do you have a fever, pain , or abdominal swelling? No. Pain Score  0 *  Have you tolerated food without any problems? Yes.    Have you been able to return to your normal activities? Yes.    Do you have any questions about your discharge instructions: Diet   No. Medications  No. Follow up visit  No.  Do you have questions or concerns about your Care? No.  Actions: * If pain score is 4 or above: No action needed, pain <4.  1. Have you developed a fever since your procedure? no  2.   Have you had an respiratory symptoms (SOB or cough) since your procedure? no  3.   Have you tested positive for COVID 19 since your procedure no  4.   Have you had any family members/close contacts diagnosed with the COVID 19 since your procedure?  no   If yes to any of these questions please route to Joylene John, RN and Erenest Rasher, RN

## 2019-07-02 ENCOUNTER — Encounter: Payer: Self-pay | Admitting: Gastroenterology

## 2019-07-08 ENCOUNTER — Encounter: Payer: BC Managed Care – PPO | Admitting: Gastroenterology

## 2020-02-22 ENCOUNTER — Encounter: Payer: BC Managed Care – PPO | Admitting: Nurse Practitioner

## 2020-03-14 DIAGNOSIS — Z1231 Encounter for screening mammogram for malignant neoplasm of breast: Secondary | ICD-10-CM | POA: Diagnosis not present

## 2020-03-30 ENCOUNTER — Other Ambulatory Visit: Payer: BC Managed Care – PPO

## 2020-03-31 ENCOUNTER — Encounter: Payer: BC Managed Care – PPO | Admitting: Nurse Practitioner

## 2020-04-07 ENCOUNTER — Other Ambulatory Visit: Payer: Self-pay

## 2020-04-07 ENCOUNTER — Other Ambulatory Visit: Payer: BC Managed Care – PPO

## 2020-04-07 DIAGNOSIS — Z0000 Encounter for general adult medical examination without abnormal findings: Secondary | ICD-10-CM

## 2020-04-08 LAB — CMP14+EGFR
ALT: 16 IU/L (ref 0–32)
AST: 27 IU/L (ref 0–40)
Albumin/Globulin Ratio: 2 (ref 1.2–2.2)
Albumin: 4.7 g/dL (ref 3.8–4.8)
Alkaline Phosphatase: 45 IU/L (ref 44–121)
BUN/Creatinine Ratio: 8 — ABNORMAL LOW (ref 12–28)
BUN: 9 mg/dL (ref 8–27)
Bilirubin Total: 0.4 mg/dL (ref 0.0–1.2)
CO2: 23 mmol/L (ref 20–29)
Calcium: 9.7 mg/dL (ref 8.7–10.3)
Chloride: 103 mmol/L (ref 96–106)
Creatinine, Ser: 1.09 mg/dL — ABNORMAL HIGH (ref 0.57–1.00)
Globulin, Total: 2.3 g/dL (ref 1.5–4.5)
Glucose: 80 mg/dL (ref 65–99)
Potassium: 4.4 mmol/L (ref 3.5–5.2)
Sodium: 142 mmol/L (ref 134–144)
Total Protein: 7 g/dL (ref 6.0–8.5)
eGFR: 58 mL/min/{1.73_m2} — ABNORMAL LOW (ref >59)

## 2020-04-08 LAB — LIPID PANEL
Chol/HDL Ratio: 2.4 ratio (ref 0.0–4.4)
Cholesterol, Total: 260 mg/dL — ABNORMAL HIGH (ref 100–199)
HDL: 109 mg/dL (ref >39)
LDL Chol Calc (NIH): 142 mg/dL — ABNORMAL HIGH (ref 0–99)
Triglycerides: 59 mg/dL (ref 0–149)
VLDL Cholesterol Cal: 9 mg/dL (ref 5–40)

## 2020-04-08 LAB — CBC
Hematocrit: 37.9 % (ref 34.0–46.6)
Hemoglobin: 12.4 g/dL (ref 11.1–15.9)
MCH: 29.5 pg (ref 26.6–33.0)
MCHC: 32.7 g/dL (ref 31.5–35.7)
MCV: 90 fL (ref 79–97)
Platelets: 218 10*3/uL (ref 150–450)
RBC: 4.2 x10E6/uL (ref 3.77–5.28)
RDW: 13.9 % (ref 11.7–15.4)
WBC: 2.6 10*3/uL — ABNORMAL LOW (ref 3.4–10.8)

## 2020-04-08 LAB — HEMOGLOBIN A1C
Est. average glucose Bld gHb Est-mCnc: 114 mg/dL
Hgb A1c MFr Bld: 5.6 % (ref 4.8–5.6)

## 2020-04-11 ENCOUNTER — Ambulatory Visit (INDEPENDENT_AMBULATORY_CARE_PROVIDER_SITE_OTHER): Payer: BC Managed Care – PPO | Admitting: Nurse Practitioner

## 2020-04-11 ENCOUNTER — Other Ambulatory Visit: Payer: Self-pay

## 2020-04-11 ENCOUNTER — Encounter: Payer: Self-pay | Admitting: Nurse Practitioner

## 2020-04-11 VITALS — BP 118/72 | HR 60 | Temp 98.1°F | Ht 65.2 in | Wt 178.2 lb

## 2020-04-11 DIAGNOSIS — E7800 Pure hypercholesterolemia, unspecified: Secondary | ICD-10-CM | POA: Diagnosis not present

## 2020-04-11 DIAGNOSIS — Z0000 Encounter for general adult medical examination without abnormal findings: Secondary | ICD-10-CM | POA: Diagnosis not present

## 2020-04-11 NOTE — Progress Notes (Signed)
I,Yamilka Roman [employer] as a Education administrator for [employer], FNP.,have documented all relevant documentation on the behalf of Minette Brine, FNP,as directed by  Minette Brine, FNP while in the presence of Minette Brine, [place].  This visit occurred during the SARS-CoV-2 public health emergency.  Safety protocols were in place, including screening questions prior to the visit, additional usage of staff PPE, and extensive cleaning of exam room while observing appropriate contact time as indicated for disinfecting solutions.  Subjective:     Patient ID: Felicia Richard , female    DOB: 29-Apr-1960 , 60 y.o.   MRN: [id number]   Chief Complaint  Patient presents with  . Annual Exam    HPI  Patient here for hm.    Her brother passed away from CHF; kidney disease (ESRD), HTN, gout, alcohol and smoker. Mother in law passed away in 01-04-23. She has cut back on boiled eggs. She does not take a fiber supplement.         History reviewed. No pertinent past medical history.   Family History  Problem Relation Age of Onset  . Pulmonary embolism Mother   . Alzheimer's disease Father   . Diabetes Maternal Uncle   . Breast cancer Maternal Grandmother   . Diabetes Maternal Grandmother   . Diabetes Cousin      Current Outpatient Medications:  .  acetaminophen (TYLENOL) 325 MG tablet, Take 650 mg by mouth every 6 (six) hours as needed., Disp: , Rfl:  .  cholecalciferol (VITAMIN D3) 25 MCG (1000 UNIT) tablet, Take 1,000 Units by mouth daily., Disp: , Rfl:  .  Multiple Vitamins-Minerals (EMERGEN-C VITAMIN C PO), Take 1 tablet by mouth daily at 6 (six) AM., Disp: , Rfl:    Allergies  Allergen Reactions  . Sulfa Antibiotics Rash      The patient states she is  status post hysterectomy Negative for: breast discharge, breast lump(s), breast pain and breast self exam. Associated symptoms include abnormal vaginal bleeding. Pertinent negatives include abnormal bleeding (hematology), anxiety, decreased libido,  depression, difficulty falling sleep, dyspareunia, history of infertility, nocturia, sexual dysfunction, sleep disturbances, urinary incontinence, urinary urgency, vaginal discharge and vaginal itching. Diet regular; she has cut back her intake of carbs and increased her vegetables. The patient states her exercise level is 3 days a week and 10 miles on Saturday walking.   The patient's tobacco use is:  Social History   Tobacco Use  Smoking Status Never Smoker  Smokeless Tobacco Never Used   She has been exposed to passive smoke. The patient's alcohol use is:  Social History   Substance and Sexual Activity  Alcohol Use Not Currently     Review of Systems  Constitutional: Negative.   HENT: Negative.   Eyes: Negative.   Respiratory: Negative.   Cardiovascular: Negative.  Negative for chest pain, palpitations and leg swelling.  Gastrointestinal: Negative.   Endocrine: Negative.   Genitourinary: Negative.   Musculoskeletal: Negative.   Skin: Negative.   Allergic/Immunologic: Negative.   Neurological: Negative.   Hematological: Negative.   Psychiatric/Behavioral: Negative.      Today's Vitals   04/11/20 1524  BP: 118/72  Pulse: 60  Temp: 98.1 F (36.7 C)  TempSrc: Oral  Weight: 178 lb 3.2 oz (80.8 kg)  Height: 5' 5.2" (1.656 m)  PainSc: 0-No pain   Body mass index is 29.47 kg/m.   Objective:  Physical Exam Constitutional:      General: She is not in acute distress.    Appearance: Normal  appearance. She is well-developed and normal weight.  HENT:     Head: Normocephalic and atraumatic.     Right Ear: Hearing, tympanic membrane, ear canal and external ear normal. There is no impacted cerumen.     Left Ear: Hearing, tympanic membrane, ear canal and external ear normal. There is no impacted cerumen.     Nose:     Comments: Deferred - masked    Mouth/Throat:     Comments: Deferred - masked Eyes:     General: Lids are normal.     Extraocular Movements: Extraocular  movements intact.     Conjunctiva/sclera: Conjunctivae normal.     Pupils: Pupils are equal, round, and reactive to light.     Funduscopic exam:    Right eye: No papilledema.        Left eye: No papilledema.  Neck:     Thyroid: No thyroid mass.     Vascular: No carotid bruit.  Cardiovascular:     Rate and Rhythm: Normal rate and regular rhythm.     Pulses: Normal pulses.     Heart sounds: Normal heart sounds. No murmur heard.   Pulmonary:     Effort: Pulmonary effort is normal. No respiratory distress.     Breath sounds: Normal breath sounds. No wheezing.  Chest:     Chest wall: No mass.  Breasts:     Tanner Score is 5.     Right: Normal. No mass, tenderness, axillary adenopathy or supraclavicular adenopathy.     Left: Normal. No mass, tenderness, axillary adenopathy or supraclavicular adenopathy.    Abdominal:     General: Abdomen is flat. Bowel sounds are normal. There is no distension.     Palpations: Abdomen is soft.     Tenderness: There is no abdominal tenderness.  Genitourinary:    Rectum: Guaiac result negative.  Musculoskeletal:        General: No swelling. Normal range of motion.     Cervical back: Full passive range of motion without pain, normal range of motion and neck supple.     Right lower leg: No edema.     Left lower leg: No edema.  Lymphadenopathy:     Upper Body:     Right upper body: No supraclavicular, axillary or pectoral adenopathy.     Left upper body: No supraclavicular, axillary or pectoral adenopathy.  Skin:    General: Skin is warm and dry.     Capillary Refill: Capillary refill takes less than 2 seconds.  Neurological:     General: No focal deficit present.     Mental Status: She is alert and oriented to person, place, and time.     Cranial Nerves: No cranial nerve deficit.     Sensory: No sensory deficit.     Motor: No weakness.  Psychiatric:        Mood and Affect: Mood normal.        Behavior: Behavior normal.        Thought  Content: Thought content normal.        Judgment: Judgment normal.         Assessment And Plan:     1. Encounter for general adult medical examination w/o abnormal findings . Behavior modifications discussed and diet history reviewed.   . Pt will continue to exercise regularly and modify diet with low GI, plant based foods and decrease intake of processed foods.  . Recommend intake of daily multivitamin, Vitamin D, and calcium.  . Recommend mammogram  and colonoscopy for preventive screenings, as well as recommend immunizations that include influenza, TDAP  . Discussed results of her labs done on 3/10 . Creatinine is slightly elevated, she is encouraged to increase water intake.  2. Elevated cholesterol  Cholesterol levels are elevated, she is encouraged to take Red Yeast Rice and I have given her fiber choice supplements as well  She will return in 3 months for follow up  Encouraged to eat a low fat diet.    Patient was given opportunity to ask questions. Patient verbalized understanding of the plan and was able to repeat key elements of the plan. All questions were answered to their satisfaction.   Minette Brine, FNP   I, Minette Brine, FNP, have reviewed all documentation for this visit. The documentation on 04/11/20 for the exam, diagnosis, procedures, and orders are all accurate and complete.   THE PATIENT IS ENCOURAGED TO PRACTICE SOCIAL DISTANCING DUE TO THE COVID-19 PANDEMIC.

## 2020-04-11 NOTE — Patient Instructions (Addendum)
Health Maintenance, Female Adopting a healthy lifestyle and getting preventive care are important in promoting health and wellness. Ask your health care provider about:  The right schedule for you to have regular tests and exams.  Things you can do on your own to prevent diseases and keep yourself healthy. What should I know about diet, weight, and exercise? Eat a healthy diet  Eat a diet that includes plenty of vegetables, fruits, low-fat dairy products, and lean protein.  Do not eat a lot of foods that are high in solid fats, added sugars, or sodium.   Maintain a healthy weight Body mass index (BMI) is used to identify weight problems. It estimates body fat based on height and weight. Your health care provider can help determine your BMI and help you achieve or maintain a healthy weight. Get regular exercise Get regular exercise. This is one of the most important things you can do for your health. Most adults should:  Exercise for at least 150 minutes each week. The exercise should increase your heart rate and make you sweat (moderate-intensity exercise).  Do strengthening exercises at least twice a week. This is in addition to the moderate-intensity exercise.  Spend less time sitting. Even light physical activity can be beneficial. Watch cholesterol and blood lipids Have your blood tested for lipids and cholesterol at 62 years of age, then have this test every 5 years. Have your cholesterol levels checked more often if:  Your lipid or cholesterol levels are high.  You are older than 62 years of age.  You are at high risk for heart disease. What should I know about cancer screening? Depending on your health history and family history, you may need to have cancer screening at various ages. This may include screening for:  Breast cancer.  Cervical cancer.  Colorectal cancer.  Skin cancer.  Lung cancer. What should I know about heart disease, diabetes, and high blood  pressure? Blood pressure and heart disease  High blood pressure causes heart disease and increases the risk of stroke. This is more likely to develop in people who have high blood pressure readings, are of African descent, or are overweight.  Have your blood pressure checked: ? Every 3-5 years if you are 18-39 years of age. ? Every year if you are 40 years old or older. Diabetes Have regular diabetes screenings. This checks your fasting blood sugar level. Have the screening done:  Once every three years after age 40 if you are at a normal weight and have a low risk for diabetes.  More often and at a younger age if you are overweight or have a high risk for diabetes. What should I know about preventing infection? Hepatitis B If you have a higher risk for hepatitis B, you should be screened for this virus. Talk with your health care provider to find out if you are at risk for hepatitis B infection. Hepatitis C Testing is recommended for:  Everyone born from 1945 through 1965.  Anyone with known risk factors for hepatitis C. Sexually transmitted infections (STIs)  Get screened for STIs, including gonorrhea and chlamydia, if: ? You are sexually active and are younger than 62 years of age. ? You are older than 62 years of age and your health care provider tells you that you are at risk for this type of infection. ? Your sexual activity has changed since you were last screened, and you are at increased risk for chlamydia or gonorrhea. Ask your health care provider   if you are at risk.  Ask your health care provider about whether you are at high risk for HIV. Your health care provider may recommend a prescription medicine to help prevent HIV infection. If you choose to take medicine to prevent HIV, you should first get tested for HIV. You should then be tested every 3 months for as long as you are taking the medicine. Pregnancy  If you are about to stop having your period (premenopausal) and  you may become pregnant, seek counseling before you get pregnant.  Take 400 to 800 micrograms (mcg) of folic acid every day if you become pregnant.  Ask for birth control (contraception) if you want to prevent pregnancy. Osteoporosis and menopause Osteoporosis is a disease in which the bones lose minerals and strength with aging. This can result in bone fractures. If you are 75 years old or older, or if you are at risk for osteoporosis and fractures, ask your health care provider if you should:  Be screened for bone loss.  Take a calcium or vitamin D supplement to lower your risk of fractures.  Be given hormone replacement therapy (HRT) to treat symptoms of menopause. Follow these instructions at home: Lifestyle  Do not use any products that contain nicotine or tobacco, such as cigarettes, e-cigarettes, and chewing tobacco. If you need help quitting, ask your health care provider.  Do not use street drugs.  Do not share needles.  Ask your health care provider for help if you need support or information about quitting drugs. Alcohol use  Do not drink alcohol if: ? Your health care provider tells you not to drink. ? You are pregnant, may be pregnant, or are planning to become pregnant.  If you drink alcohol: ? Limit how much you use to 0-1 drink a day. ? Limit intake if you are breastfeeding.  Be aware of how much alcohol is in your drink. In the [place] one drink equals one 12 oz bottle of beer (355 mL), one 5 oz glass of wine (148 mL), or one 1 oz glass of hard liquor (44 mL). General instructions  Schedule regular health, dental, and eye exams.  Stay current with your vaccines.  Tell your health care provider if: ? You often feel depressed. ? You have ever been abused or do not feel safe at home. Summary  Adopting a healthy lifestyle and getting preventive care are important in promoting health and wellness.  Follow your health care provider's instructions about healthy  diet, exercising, and getting tested or screened for diseases.  Follow your health care provider's instructions on monitoring your cholesterol and blood pressure. This information is not intended to replace advice given to you by your health care provider. Make sure you discuss any questions you have with your health care provider. Document Revised: 01/08/2018 Document Reviewed: 01/08/2018 Elsevier Patient Education  2021 [place] Heart-healthy meal planning includes:  Eating less unhealthy fats.  Eating more healthy fats.  Making other changes in your diet. Talk with your doctor or a diet specialist (dietitian) to create an eating plan that is right for you. What is my plan? Your doctor may recommend an eating plan that includes:  Total fat: ______% or less of total calories a day.  Saturated fat: ______% or less of total calories a day.  Cholesterol: less than _________mg a day. What are tips for following this plan? Cooking Avoid frying your food. Try to bake, boil, grill, or broil it  instead. You can also reduce fat by:  Removing the skin from poultry.  Removing all visible fats from meats.  Steaming vegetables in water or broth. Meal planning  At meals, divide your plate into four equal parts: ? Fill one-half of your plate with vegetables and green salads. ? Fill one-fourth of your plate with whole grains. ? Fill one-fourth of your plate with lean protein foods.  Eat 4-5 servings of vegetables per day. A serving of vegetables is: ? 1 cup of raw or cooked vegetables. ? 2 cups of raw leafy greens.  Eat 4-5 servings of fruit per day. A serving of fruit is: ? 1 medium whole fruit. ?  cup of dried fruit. ?  cup of fresh, frozen, or canned fruit. ?  cup of 100% fruit juice.  Eat more foods that have soluble fiber. These are apples, broccoli, carrots, beans, peas, and barley. Try to get 20-30 g of fiber per day.  Eat 4-5  servings of nuts, legumes, and seeds per week: ? 1 serving of dried beans or legumes equals  cup after being cooked. ? 1 serving of nuts is  cup. ? 1 serving of seeds equals 1 tablespoon.   General information  Eat more home-cooked food. Eat less restaurant, buffet, and fast food.  Limit or avoid alcohol.  Limit foods that are high in starch and sugar.  Avoid fried foods.  Lose weight if you are overweight.  Keep track of how much salt (sodium) you eat. This is important if you have high blood pressure. Ask your doctor to tell you more about this.  Try to add vegetarian meals each week. Fats  Choose healthy fats. These include olive oil and canola oil, flaxseeds, walnuts, almonds, and seeds.  Eat more omega-3 fats. These include salmon, mackerel, sardines, tuna, flaxseed oil, and ground flaxseeds. Try to eat fish at least 2 times each week.  Check food labels. Avoid foods with trans fats or high amounts of saturated fat.  Limit saturated fats. ? These are often found in animal products, such as meats, butter, and cream. ? These are also found in plant foods, such as palm oil, palm kernel oil, and coconut oil.  Avoid foods with partially hydrogenated oils in them. These have trans fats. Examples are stick margarine, some tub margarines, cookies, crackers, and other baked goods. What foods can I eat? Fruits All fresh, canned (in natural juice), or frozen fruits. Vegetables Fresh or frozen vegetables (raw, steamed, roasted, or grilled). Green salads. Grains Most grains. Choose whole wheat and whole grains most of the time. Rice and pasta, including brown rice and pastas made with whole wheat. Meats and other proteins Lean, well-trimmed beef, veal, pork, and lamb. Chicken and [place] without skin. All fish and shellfish. Wild duck, rabbit, pheasant, and venison. Egg whites or low-cholesterol egg substitutes. Dried beans, peas, lentils, and tofu. Seeds and most  nuts. Dairy Low-fat or nonfat cheeses, including ricotta and mozzarella. Skim or 1% milk that is liquid, powdered, or evaporated. Buttermilk that is made with low-fat milk. Nonfat or low-fat yogurt. Fats and oils Non-hydrogenated (trans-free) margarines. Vegetable oils, including soybean, sesame, sunflower, olive, peanut, safflower, corn, canola, and cottonseed. Salad dressings or mayonnaise made with a vegetable oil. Beverages Mineral water. Coffee and tea. Diet carbonated beverages. Sweets and desserts Sherbet, gelatin, and fruit ice. Small amounts of dark chocolate. Limit all sweets and desserts. Seasonings and condiments All seasonings and condiments. The items listed above may not be a complete list  of foods and drinks you can eat. Contact a dietitian for more options. What foods should I avoid? Fruits Canned fruit in heavy syrup. Fruit in cream or butter sauce. Fried fruit. Limit coconut. Vegetables Vegetables cooked in cheese, cream, or butter sauce. Fried vegetables. Grains Breads that are made with saturated or trans fats, oils, or whole milk. Croissants. Sweet rolls. Donuts. High-fat crackers, such as cheese crackers. Meats and other proteins Fatty meats, such as hot dogs, ribs, sausage, bacon, rib-eye roast or steak. High-fat deli meats, such as salami and bologna. Caviar. Domestic duck and goose. Organ meats, such as liver. Dairy Cream, sour cream, cream cheese, and creamed cottage cheese. Whole-milk cheeses. Whole or 2% milk that is liquid, evaporated, or condensed. Whole buttermilk. Cream sauce or high-fat cheese sauce. Yogurt that is made from whole milk. Fats and oils Meat fat, or shortening. Cocoa butter, hydrogenated oils, palm oil, coconut oil, palm kernel oil. Solid fats and shortenings, including bacon fat, salt pork, lard, and butter. Nondairy cream substitutes. Salad dressings with cheese or sour cream. Beverages Regular sodas and juice drinks with added  sugar. Sweets and desserts Frosting. Pudding. Cookies. Cakes. Pies. Milk chocolate or white chocolate. Buttered syrups. Full-fat ice cream or ice cream drinks. The items listed above may not be a complete list of foods and drinks to avoid. Contact a dietitian for more information. Summary  Heart-healthy meal planning includes eating less unhealthy fats, eating more healthy fats, and making other changes in your diet.  Eat a balanced diet. This includes fruits and vegetables, low-fat or nonfat dairy, lean protein, nuts and legumes, whole grains, and heart-healthy oils and fats. This information is not intended to replace advice given to you by your health care provider. Make sure you discuss any questions you have with your health care provider. Document Revised: 03/21/2017 Document Reviewed: 02/22/2017 Elsevier Patient Education  2021 [place].  - purchase red yeast rice 1200 mg daily - also take a fiber supplements daily to help with your cholesterol

## 2020-07-18 ENCOUNTER — Ambulatory Visit: Payer: BC Managed Care – PPO | Admitting: Nurse Practitioner

## 2020-08-24 DIAGNOSIS — H2513 Age-related nuclear cataract, bilateral: Secondary | ICD-10-CM | POA: Diagnosis not present

## 2020-08-24 DIAGNOSIS — H524 Presbyopia: Secondary | ICD-10-CM | POA: Diagnosis not present

## 2020-08-24 DIAGNOSIS — H04123 Dry eye syndrome of bilateral lacrimal glands: Secondary | ICD-10-CM | POA: Diagnosis not present

## 2020-08-24 DIAGNOSIS — H25013 Cortical age-related cataract, bilateral: Secondary | ICD-10-CM | POA: Diagnosis not present

## 2020-08-25 ENCOUNTER — Encounter: Payer: Self-pay | Admitting: Nurse Practitioner

## 2020-08-25 ENCOUNTER — Ambulatory Visit (INDEPENDENT_AMBULATORY_CARE_PROVIDER_SITE_OTHER): Payer: BC Managed Care – PPO | Admitting: Nurse Practitioner

## 2020-08-25 ENCOUNTER — Other Ambulatory Visit: Payer: Self-pay

## 2020-08-25 VITALS — BP 116/78 | HR 68 | Temp 98.1°F | Ht 65.0 in | Wt 175.0 lb

## 2020-08-25 DIAGNOSIS — E7800 Pure hypercholesterolemia, unspecified: Secondary | ICD-10-CM | POA: Diagnosis not present

## 2020-08-25 DIAGNOSIS — E663 Overweight: Secondary | ICD-10-CM

## 2020-08-25 DIAGNOSIS — R7989 Other specified abnormal findings of blood chemistry: Secondary | ICD-10-CM

## 2020-08-25 DIAGNOSIS — R911 Solitary pulmonary nodule: Secondary | ICD-10-CM | POA: Diagnosis not present

## 2020-08-25 DIAGNOSIS — IMO0001 Reserved for inherently not codable concepts without codable children: Secondary | ICD-10-CM

## 2020-08-25 NOTE — Progress Notes (Signed)
I,Latrenda Irani,acting as a Education administrator for Minette Brine, FNP.,have documented all relevant documentation on the behalf of Minette Brine, FNP,as directed by  Minette Brine, FNP while in the presence of Minette Brine, [place].   This visit occurred during the SARS-CoV-2 public health emergency.  Safety protocols were in place, including screening questions prior to the visit, additional usage of staff PPE, and extensive cleaning of exam room while observing appropriate contact time as indicated for disinfecting solutions.  Subjective:     Patient ID: Felicia Richard , female    DOB: Nov 27, 1960 , 60 y.o.   MRN: [id number]   Chief Complaint  Patient presents with   Hyperlipidemia     HPI  Pt is here for a chol follow up, she would like to also get her shingrix vaccination that she is due for. No other questions or concerns at the moment.  She has cut back on her eggs. She did eat an omelet on Sunday.   Wt Readings from Last 3 Encounters: 08/25/20 : 175 lb (79.4 kg) 04/11/20 : 178 lb 3.2 oz (80.8 kg) 06/18/19 : 175 lb (79.4 kg)    Hyperlipidemia This is a chronic problem. The current episode started more than 1 year ago. Pertinent negatives include no chest pain.    History reviewed. No pertinent past medical history.   Family History  Problem Relation Age of Onset   Pulmonary embolism Mother    Alzheimer's disease Father    Diabetes Maternal Uncle    Breast cancer Maternal Grandmother    Diabetes Maternal Grandmother    Diabetes Cousin      Current Outpatient Medications:    cholecalciferol (VITAMIN D3) 25 MCG (1000 UNIT) tablet, Take 1,000 Units by mouth daily., Disp: , Rfl:    Multiple Vitamins-Minerals (EMERGEN-C VITAMIN C PO), Take 1 tablet by mouth daily at 6 (six) AM., Disp: , Rfl:    acetaminophen (TYLENOL) 325 MG tablet, Take 650 mg by mouth every 6 (six) hours as needed. (Patient not taking: Reported on 08/25/2020), Disp: , Rfl:    Allergies  Allergen Reactions   Sulfa Antibiotics  Rash     Review of Systems  Constitutional: Negative.   HENT: Negative.    Eyes: Negative.   Respiratory: Negative.    Cardiovascular: Negative.  Negative for chest pain, palpitations and leg swelling.  Gastrointestinal: Negative.   Endocrine: Negative.   Genitourinary: Negative.   Musculoskeletal: Negative.   Skin: Negative.   Allergic/Immunologic: Negative.   Neurological: Negative.  Negative for dizziness and headaches.  Hematological: Negative.   Psychiatric/Behavioral: Negative.      Today's Vitals   08/25/20 0916  BP: 116/78  Pulse: 68  Temp: 98.1 F (36.7 C)  Weight: 175 lb (79.4 kg)  Height: _0  (1.651 m)  PainSc: 0-No pain   Body mass index is 29.12 kg/m.  Wt Readings from Last 3 Encounters:  08/25/20 175 lb (79.4 kg)  04/11/20 178 lb 3.2 oz (80.8 kg)  06/18/19 175 lb (79.4 kg)    Objective:  Physical Exam Vitals reviewed.  Constitutional:      General: She is not in acute distress.    Appearance: Normal appearance. She is well-developed.  Cardiovascular:     Rate and Rhythm: Normal rate and regular rhythm.     Pulses: Normal pulses.     Heart sounds: Normal heart sounds. No murmur heard. Pulmonary:     Effort: Pulmonary effort is normal. No respiratory distress.     Breath sounds: Normal  breath sounds. No wheezing.  Abdominal:     Palpations: There is no hepatomegaly.     Tenderness: Negative signs include Murphy's sign.  Skin:    General: Skin is warm and dry.  Neurological:     General: No focal deficit present.     Mental Status: She is alert and oriented to person, place, and time.     Cranial Nerves: No cranial nerve deficit.  Psychiatric:        Mood and Affect: Mood normal.        Behavior: Behavior normal.        Thought Content: Thought content normal.        Judgment: Judgment normal.        Assessment And Plan:     1. Elevated cholesterol Comments: Cholesterol levels were more elevated, continue with Red Yeast Rice Continues  to be hesitant to take statin - Lipid panel - BMP8+eGFR  2. Overweight with body mass index (BMI) 25.0-29.9 Continue with regular exercise.  3. Abnormal CBC Comments: Has low WBC at last visit, will recheck today - CBC with Differential/Platelet  4. Lung nodule < 6cm on CT Comments: Incidental finding of 4 mm nodule to lung with CT abdomen in 03/2019 No history of smoking - CT Chest Wo Contrast; Future    Patient was given opportunity to ask questions. Patient verbalized understanding of the plan and was able to repeat key elements of the plan. All questions were answered to their satisfaction.  Minette Brine, FNP   I, Minette Brine, FNP, have reviewed all documentation for this visit. The documentation on 08/25/20 for the exam, diagnosis, procedures, and orders are all accurate and complete.   IF YOU HAVE BEEN REFERRED TO A SPECIALIST, IT MAY TAKE 1-2 WEEKS TO SCHEDULE/PROCESS THE REFERRAL. IF YOU HAVE NOT HEARD FROM US/SPECIALIST IN TWO WEEKS, PLEASE GIVE [place] A CALL AT [phone].   THE PATIENT IS ENCOURAGED TO PRACTICE SOCIAL DISTANCING DUE TO THE COVID-19 PANDEMIC.

## 2020-08-25 NOTE — Patient Instructions (Signed)
Dyslipidemia Dyslipidemia is an imbalance of waxy, fat-like substances (lipids) in the blood. The body needs lipids in small amounts. Dyslipidemia often involves a high level of cholesterol or triglycerides, which are types oflipids. Common forms of dyslipidemia include: High levels of LDL cholesterol. LDL is the type of cholesterol that causes fatty deposits (plaques) to build up in the blood vessels that carry blood away from your heart (arteries). Low levels of HDL cholesterol. HDL cholesterol is the type of cholesterol that protects against heart disease. High levels of HDL remove the LDL buildup from arteries. High levels of triglycerides. Triglycerides are a fatty substance in the blood that is linked to a buildup of plaques in the arteries. What are the causes? Primary dyslipidemia is caused by changes (mutations) in genes that are passed down through families (inherited). These mutations cause several types of dyslipidemia. Secondary dyslipidemia is caused by lifestyle choices and diseases that lead to dyslipidemia, such as: Eating a diet that is high in animal fat. Not getting enough exercise. Having diabetes, kidney disease, liver disease, or thyroid disease. Drinking large amounts of alcohol. Using certain medicines. What increases the risk? You are more likely to develop this condition if you are an older man or if you are a woman who has gone through menopause. Other risk factors include: Having a family history of dyslipidemia. Taking certain medicines, including birth control pills, steroids, some diuretics, and beta-blockers. Smoking cigarettes. Eating a high-fat diet. Having certain medical conditions such as diabetes, polycystic ovary syndrome (PCOS), kidney disease, liver disease, or hypothyroidism. Not exercising regularly. Being overweight or obese with too much belly fat. What are the signs or symptoms? In most cases, dyslipidemia does not usually cause any symptoms. In  severe cases, very high lipid levels can cause: Fatty bumps under the skin (xanthomas). White or gray ring around the black center (pupil) of the eye. Very high triglyceride levels can cause inflammation of the pancreas (pancreatitis). How is this diagnosed? Your health care provider may diagnose dyslipidemia based on a routine blood test (fasting blood test). Because most people do not have symptoms of the condition, this blood testing (lipid profile) is done on adults age 40 and older and is repeated every 5 years. This test checks: Total cholesterol. This measures the total amount of cholesterol in your blood, including LDL cholesterol, HDL cholesterol, and triglycerides. A healthy number is below 200. LDL cholesterol. The target number for LDL cholesterol is different for each person, depending on individual risk factors. Ask your health care provider what your LDL cholesterol should be. HDL cholesterol. An HDL level of 60 or higher is best because it helps to protect against heart disease. A number below 68 for men or below 2 for women increases the risk for heart disease. Triglycerides. A healthy triglyceride number is below 150. If your lipid profile is abnormal, your health care provider may do other bloodtests. How is this treated? Treatment depends on the type of dyslipidemia that you have and your other risk factors for heart disease and stroke. Your health care provider will have atarget range for your lipid levels based on this information. For many people, this condition may be treated by lifestyle changes, such as diet and exercise. Your health care provider may recommend that you: Get regular exercise. Make changes to your diet. Quit smoking if you smoke. If diet changes and exercise do not help you reach your goals, your health care provider may also prescribe medicine to lower lipids. The most commonly  prescribed type of medicine lowers your LDL cholesterol (statin drug). If you  have a high triglyceride level, your provider may prescribe another type of drug (fibrate) or an omega-3 fish oil supplement, or both. Follow these instructions at home:  Eating and drinking Follow instructions from your health care provider or dietitian about eating or drinking restrictions. Eat a healthy diet as told by your health care provider. This can help you reach and maintain a healthy weight, lower your LDL cholesterol, and raise your HDL cholesterol. This may include: Limiting your calories, if you are overweight. Eating more fruits, vegetables, whole grains, fish, and lean meats. Limiting saturated fat, trans fat, and cholesterol. If you drink alcohol: Limit how much you use. Be aware of how much alcohol is in your drink. In the [place] one drink equals one 12 oz bottle of beer (355 mL), one 5 oz glass of wine (148 mL), or one 1 oz glass of hard liquor (44 mL). Do not drink alcohol if: Your health care provider tells you not to drink. You are pregnant, may be pregnant, or are planning to become pregnant. Activity Get regular exercise. Start an exercise and strength training program as told by your health care provider. Ask your health care provider what activities are safe for you. Your health care provider may recommend: 30 minutes of aerobic activity 4-6 days a week. Brisk walking is an example of aerobic activity. Strength training 2 days a week. General instructions Do not use any products that contain nicotine or tobacco, such as cigarettes, e-cigarettes, and chewing tobacco. If you need help quitting, ask your health care provider. Take over-the-counter and prescription medicines only as told by your health care provider. This includes supplements. Keep all follow-up visits as told by your health care provider. Contact a health care provider if: You are: Having trouble sticking to your exercise or diet plan. Struggling to quit smoking or control your use of  alcohol. Summary Dyslipidemia often involves a high level of cholesterol or triglycerides, which are types of lipids. Treatment depends on the type of dyslipidemia that you have and your other risk factors for heart disease and stroke. For many people, treatment starts with lifestyle changes, such as diet and exercise. Your health care provider may prescribe medicine to lower lipids. This information is not intended to replace advice given to you by your health care provider. Make sure you discuss any questions you have with your healthcare provider. Document Revised: 09/09/2017 Document Reviewed: 08/16/2017 Elsevier Patient Education  [place].

## 2020-08-28 LAB — BMP8+EGFR
BUN/Creatinine Ratio: 18 (ref 12–28)
BUN: 17 mg/dL (ref 8–27)
CO2: 20 mmol/L (ref 20–29)
Calcium: 9.7 mg/dL (ref 8.7–10.3)
Chloride: 102 mmol/L (ref 96–106)
Creatinine, Ser: 0.93 mg/dL (ref 0.57–1.00)
Glucose: 82 mg/dL (ref 65–99)
Potassium: 4.4 mmol/L (ref 3.5–5.2)
Sodium: 142 mmol/L (ref 134–144)
eGFR: 69 mL/min/{1.73_m2} (ref >59)

## 2020-08-28 LAB — CBC WITH DIFFERENTIAL/PLATELET
Basophils Absolute: 0 10*3/uL (ref 0.0–0.2)
Basos: 2 %
EOS (ABSOLUTE): 0.2 10*3/uL (ref 0.0–0.4)
Eos: 7 %
Hematocrit: 35.7 % (ref 34.0–46.6)
Hemoglobin: 11.8 g/dL (ref 11.1–15.9)
Immature Grans (Abs): 0 10*3/uL (ref 0.0–0.1)
Immature Granulocytes: 0 %
Lymphocytes Absolute: 1 10*3/uL (ref 0.7–3.1)
Lymphs: 40 %
MCH: 30.3 pg (ref 26.6–33.0)
MCHC: 33.1 g/dL (ref 31.5–35.7)
MCV: 92 fL (ref 79–97)
Monocytes Absolute: 0.4 10*3/uL (ref 0.1–0.9)
Monocytes: 15 %
Neutrophils Absolute: 0.9 10*3/uL — ABNORMAL LOW (ref 1.4–7.0)
Neutrophils: 36 %
Platelets: 211 10*3/uL (ref 150–450)
RBC: 3.9 x10E6/uL (ref 3.77–5.28)
RDW: 14.3 % (ref 11.7–15.4)
WBC: 2.6 10*3/uL — ABNORMAL LOW (ref 3.4–10.8)

## 2020-08-28 LAB — LIPID PANEL
Chol/HDL Ratio: 2.4 ratio (ref 0.0–4.4)
Cholesterol, Total: 224 mg/dL — ABNORMAL HIGH (ref 100–199)
HDL: 92 mg/dL (ref >39)
LDL Chol Calc (NIH): 122 mg/dL — ABNORMAL HIGH (ref 0–99)
Triglycerides: 60 mg/dL (ref 0–149)
VLDL Cholesterol Cal: 10 mg/dL (ref 5–40)

## 2020-08-30 ENCOUNTER — Ambulatory Visit: Payer: BC Managed Care – PPO

## 2020-12-28 ENCOUNTER — Ambulatory Visit: Payer: BC Managed Care – PPO | Admitting: Nurse Practitioner

## 2021-04-17 ENCOUNTER — Encounter: Payer: BC Managed Care – PPO | Admitting: Nurse Practitioner

## 2022-02-16 IMAGING — CT CT ABD-PELV W/O CM
2 of 4 series · 13 of 46 positions shown, 15 images · non-contrast
Comparison: None.

CLINICAL DATA: Left lower quadrant abdominal pain.

EXAM:
CT ABDOMEN AND PELVIS WITHOUT CONTRAST
TECHNIQUE: Multidetector CT imaging of the abdomen and pelvis was performed
following the standard protocol without IV contrast.

[Series 2: routine abdomen pelvis without 5.00 br40 s3 axial · axial · non-contrast · 0.58mm/px · z∈[+1167,+1512]mm · 10 of 85 slices shown, 12 images]
[im 8/85  soft-tissue]
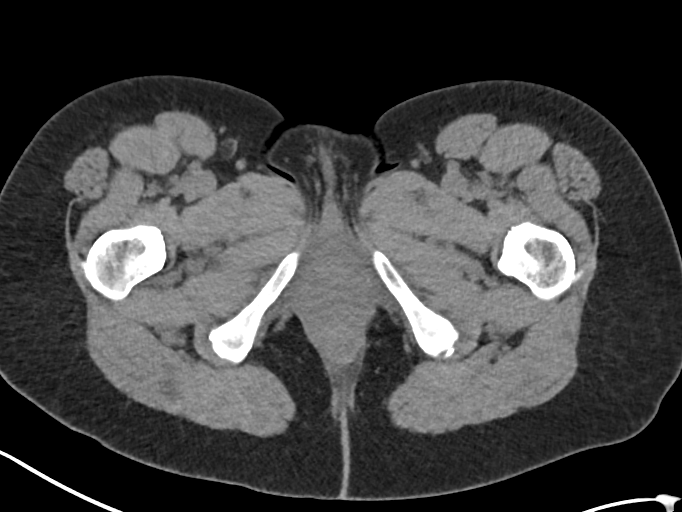
[im 8/85  bone]
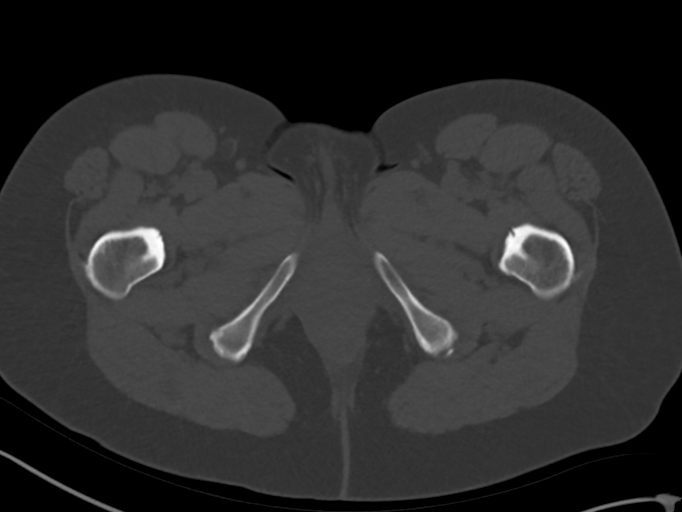
[im 15/85  soft-tissue]
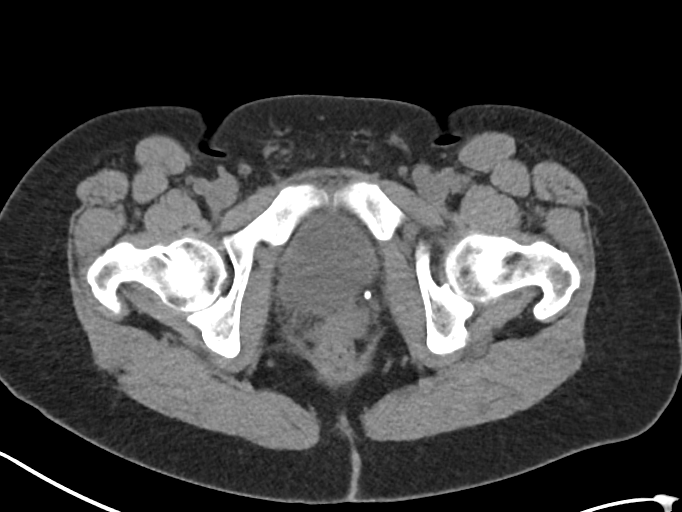
[im 22/85  soft-tissue]
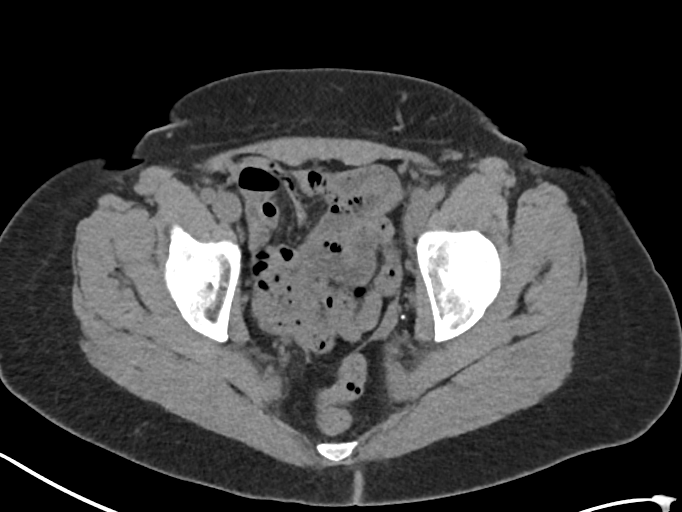
[im 30/85  soft-tissue]
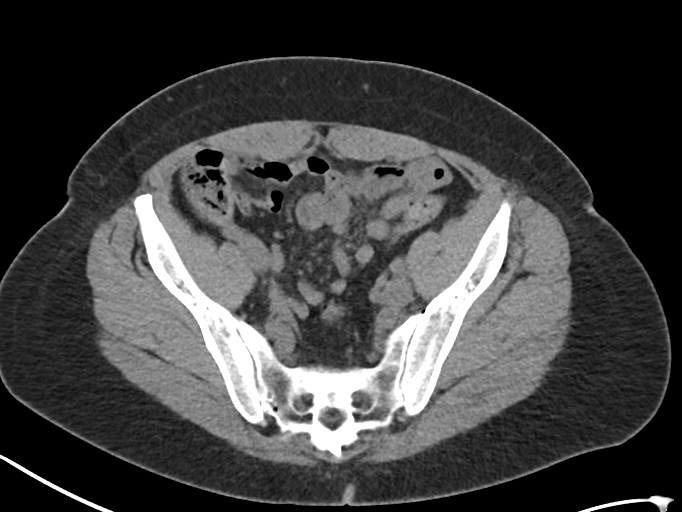
[im 37/85  soft-tissue]
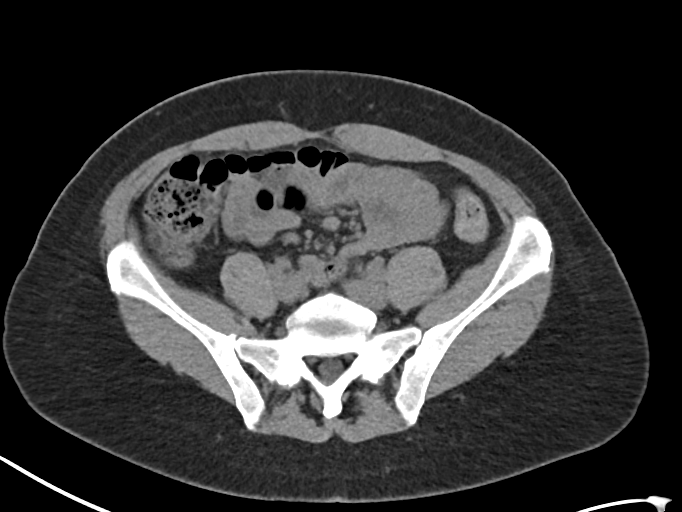
[im 48/85  soft-tissue]
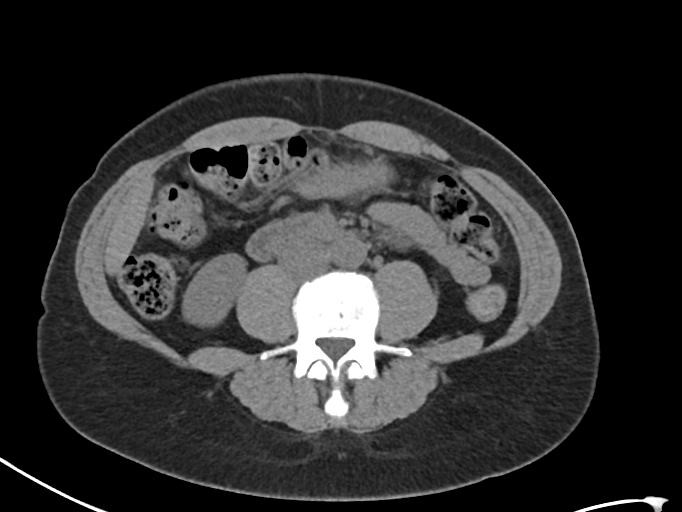
[im 55/85  soft-tissue]
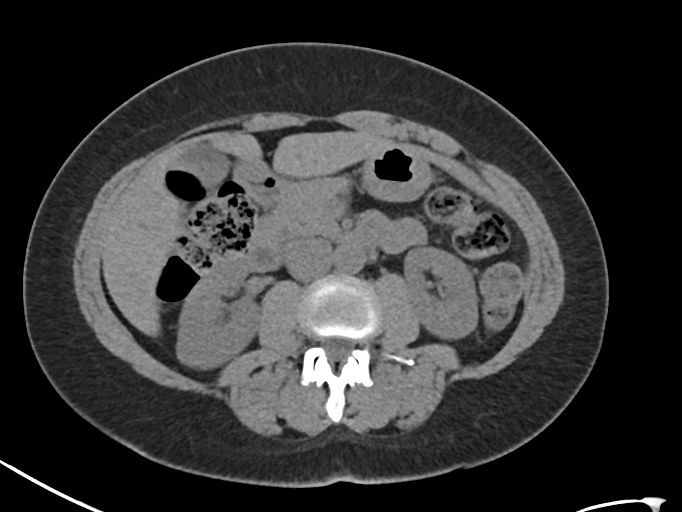
[im 63/85  soft-tissue]
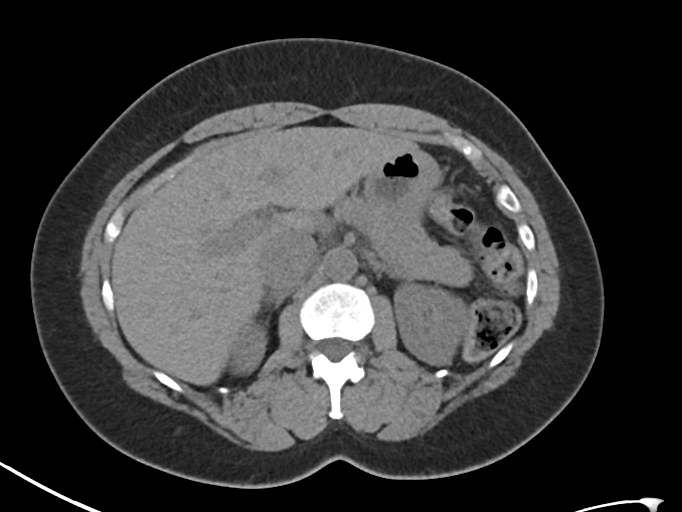
[im 70/85  soft-tissue]
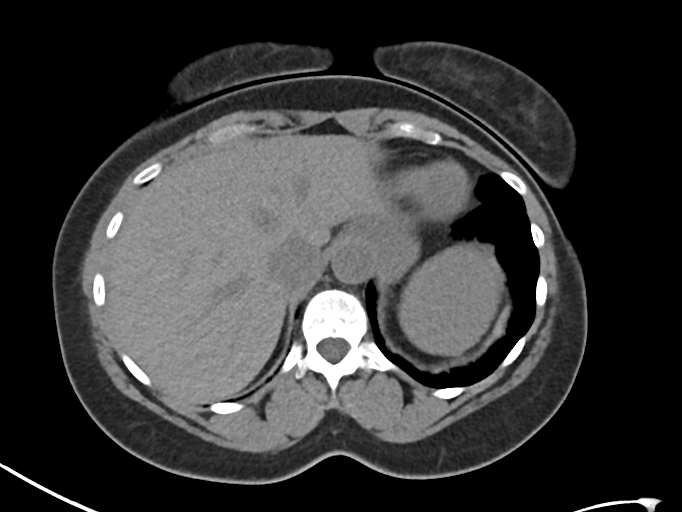
[im 70/85  bone]
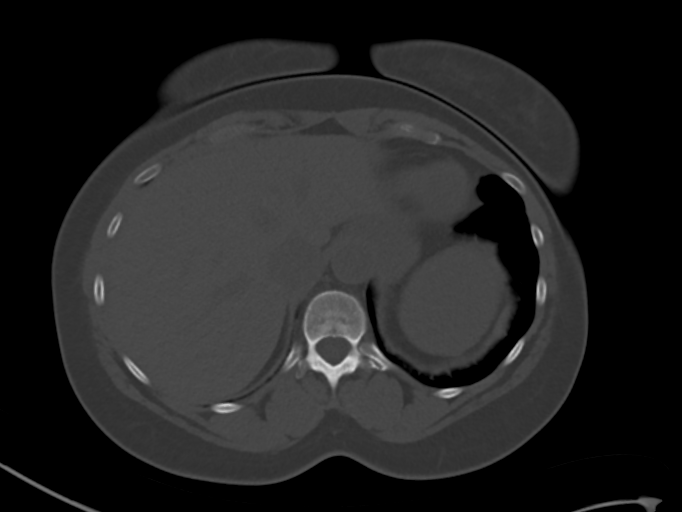
[im 77/85  soft-tissue]
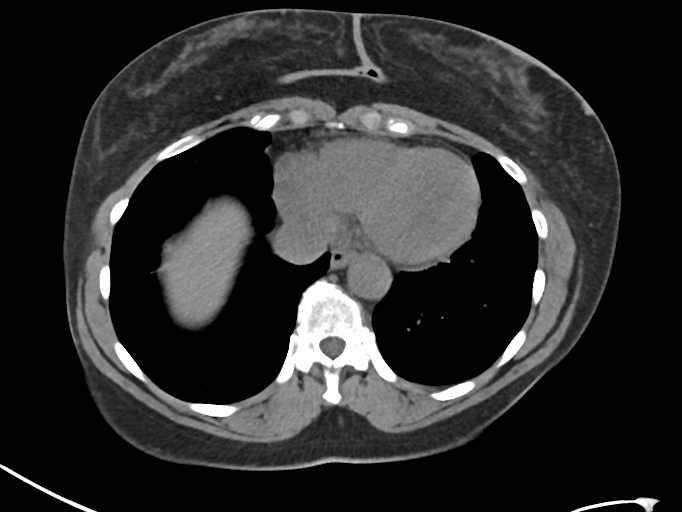

[Series 4: routine abdomen pelvis without 2.00 br40 s3 cor · coronal · non-contrast · 0.78mm/px · 3 of 149 slices shown]
[im 50/149  soft-tissue]
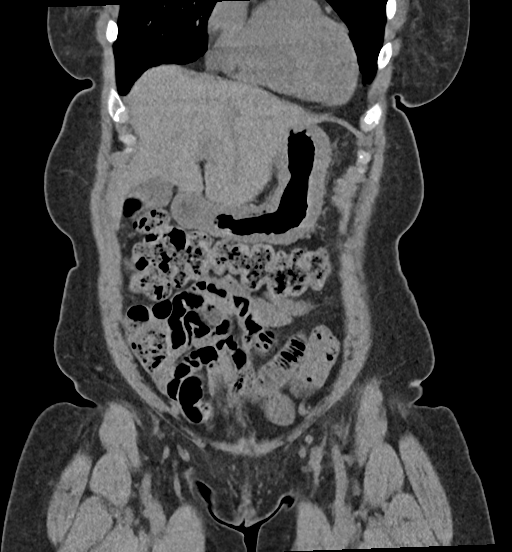
[im 66/149  soft-tissue]
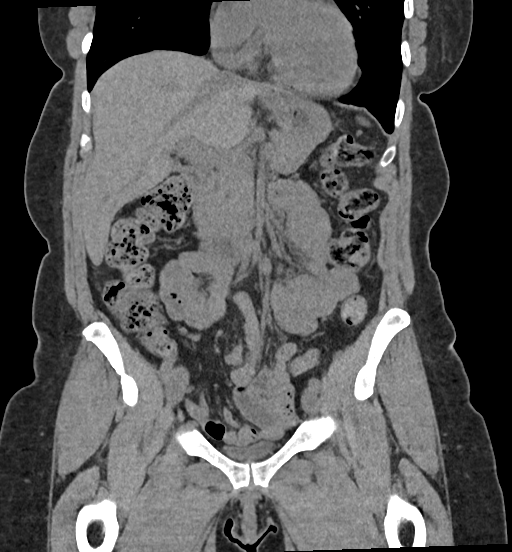
[im 83/149  soft-tissue]
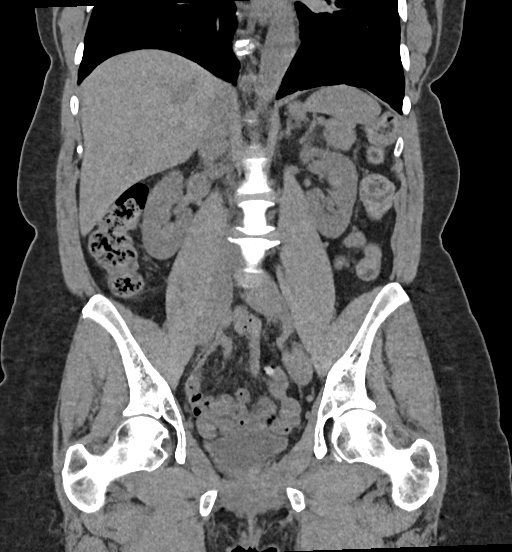

[13 of 46 positions shown; findings below may reference images not displayed]

FINDINGS: Lower chest: 4 mm nodule is noted posteriorly in right costophrenic
sulcus. Otherwise visualized lung bases are unremarkable.

Hepatobiliary: No gallstones are noted. No biliary dilatation is
noted. Multiple rounded low densities are noted throughout the liver
most consistent with hepatic cysts.

Pancreas: Unremarkable. No pancreatic ductal dilatation or
surrounding inflammatory changes.

Spleen: Normal in size without focal abnormality.

Adrenals/Urinary Tract: Adrenal glands are unremarkable. Kidneys are
normal, without renal calculi, focal lesion, or hydronephrosis.
Bladder is unremarkable.

Stomach/Bowel: Stomach is within normal limits. Appendix appears
normal. No evidence of bowel wall thickening, distention, or
inflammatory changes.

Vascular/Lymphatic: No significant vascular findings are present. No
enlarged abdominal or pelvic lymph nodes.

Reproductive: Status post hysterectomy. No adnexal masses.

Other: No abdominal wall hernia or abnormality. No abdominopelvic
ascites.

Musculoskeletal: No acute or significant osseous findings.
IMPRESSION: 1. 4 mm nodule seen posteriorly in right costophrenic sulcus. No
follow-up needed if patient is low-risk. Non-contrast chest CT can
be considered in 12 months if patient is high-risk. This
recommendation follows the consensus statement: Guidelines for
Management of Incidental Pulmonary Nodules Detected on CT Images:
2. Multiple rounded low densities are noted throughout the liver
most consistent with hepatic cysts.
3. No other abnormality seen in the abdomen or pelvis.

## 2024-02-11 LAB — HM MAMMOGRAPHY

## 2024-02-12 ENCOUNTER — Encounter: Payer: Self-pay | Admitting: Nurse Practitioner

## 2024-02-13 ENCOUNTER — Ambulatory Visit: Admission: EM | Admit: 2024-02-13 | Discharge: 2024-02-13 | Disposition: A | Discharge status: Home / Self Care

## 2024-02-13 ENCOUNTER — Encounter: Payer: Self-pay | Admitting: *Deleted

## 2024-02-13 DIAGNOSIS — H6991 Unspecified Eustachian tube disorder, right ear: Secondary | ICD-10-CM

## 2024-02-13 MED ORDER — PSEUDOEPHEDRINE HCL 30 MG PO TABS: 30.0000 mg | ORAL_TABLET | ORAL | 0 refills | Status: AC | PRN

## 2024-02-13 MED ORDER — PREDNISONE 50 MG PO TABS: ORAL_TABLET | ORAL | 0 refills | Status: AC

## 2024-02-13 MED ORDER — FLUTICASONE PROPIONATE 50 MCG/ACT NA SUSP: 1.0000 | Freq: Every day | NASAL | 0 refills | Status: AC

## 2024-02-13 NOTE — Discharge Instructions (Signed)
 I have prescribed prednisone , flonase , and sudafed for your symptoms. If they do not improve in 3-5 days we should follow-up with ENT.

## 2024-02-13 NOTE — ED Provider Notes (Signed)
 " [facility]    CSN: [id number] Arrival date & time: 02/13/24  0803      History   Chief Complaint Chief Complaint  Patient presents with   Otalgia    HPI Felicia Richard is a 64 y.o. female.   Pt presents today due to 1 week of right ear pain. Pt states that has been able to maintain hearing but pain is radiating down right jaw. Pt states that she has been using Tylenol for pain with minimal relief.   The history is provided by the patient.  Otalgia   History reviewed. No pertinent past medical history.  Patient Active Problem List   Diagnosis Date Noted   Left lower quadrant abdominal pain 02/09/2019   Flank pain 02/09/2019    Past Surgical History:  Procedure Laterality Date   ABDOMINAL HYSTERECTOMY     CYST REMOVAL NECK     removed from lymph node    OB History   No obstetric history on file.      Home Medications    Prior to Admission medications  Medication Sig Start Date End Date Taking? Authorizing Provider  acetaminophen (TYLENOL) 325 MG tablet Take 650 mg by mouth every 6 (six) hours as needed.   Yes [provider]  cholecalciferol (VITAMIN D3) 25 MCG (1000 UNIT) tablet Take 1,000 Units by mouth daily.   Yes [provider]  fluticasone  (FLONASE ) 50 MCG/ACT nasal spray Place 1 spray into both nostrils daily. 02/13/24  Yes Andra Corean BROCKS, PA-C  Multiple Vitamins-Minerals (EMERGEN-C VITAMIN C PO) Take 1 tablet by mouth daily at 6 (six) AM.   Yes [provider]  predniSONE  (DELTASONE ) 50 MG tablet Take 1 tab po daily for 5 days 02/13/24  Yes Andra Corean BROCKS, PA-C  pseudoephedrine  (SUDAFED) 30 MG tablet Take 1 tablet (30 mg total) by mouth every 4 (four) hours as needed for congestion. 02/13/24  Yes Andra Corean BROCKS, PA-C    Family History Family History  Problem Relation Age of Onset   Pulmonary embolism Mother    Alzheimer's disease Father    Diabetes Maternal Uncle    Breast cancer Maternal  Grandmother    Diabetes Maternal Grandmother    Diabetes Cousin     Social History Social History[1]   Allergies   Sulfa antibiotics   Review of Systems Review of Systems  HENT:  Positive for ear pain.      Physical Exam Triage Vital Signs ED Triage Vitals  Encounter Vitals Group     BP 02/13/24 0836 131/76     Girls Systolic BP Percentile --      Girls Diastolic BP Percentile --      Boys Systolic BP Percentile --      Boys Diastolic BP Percentile --      Pulse Rate 02/13/24 0836 (!) 57     Resp 02/13/24 0836 16     Temp 02/13/24 0836 97.9 F (36.6 C)     Temp Source 02/13/24 0836 Oral     SpO2 02/13/24 0836 97 %     Weight --      Height --      Head Circumference --      Peak Flow --      Pain Score 02/13/24 0834 7     Pain Loc --      Pain Education --      Exclude from Growth Chart --    No data found.  Updated Vital Signs  BP 131/76 (BP Location: Right Arm)   Pulse (!) 57   Temp 97.9 F (36.6 C) (Oral)   Resp 16   SpO2 97%   Visual Acuity Right Eye Distance:   Left Eye Distance:   Bilateral Distance:    Right Eye Near:   Left Eye Near:    Bilateral Near:     Physical Exam Vitals and nursing note reviewed.  Constitutional:      General: She is not in acute distress.    Appearance: Normal appearance. She is not ill-appearing, toxic-appearing or diaphoretic.  HENT:     Right Ear: A middle ear effusion (serous fluid, bubbles present) is present.     Left Ear: Tympanic membrane, ear canal and external ear normal.     Nose: Congestion (moderately enlarged turbinates) present. No rhinorrhea.  Eyes:     General: No scleral icterus. Cardiovascular:     Rate and Rhythm: Normal rate and regular rhythm.     Heart sounds: Normal heart sounds.  Pulmonary:     Effort: Pulmonary effort is normal. No respiratory distress.     Breath sounds: Normal breath sounds. No wheezing or rhonchi.  Skin:    General: Skin is warm.  Neurological:     Mental  Status: She is alert and oriented to person, place, and time.  Psychiatric:        Mood and Affect: Mood normal.        Behavior: Behavior normal.      UC Treatments / Results  Labs (all labs ordered are listed, but only abnormal results are displayed) Labs Reviewed - No data to display  EKG   Radiology No results found.  Procedures Procedures (including critical care time)  Medications Ordered in UC Medications - No data to display  Initial Impression / Assessment and Plan / UC Course  I have reviewed the triage vital signs and the nursing notes.  Pertinent labs & imaging results that were available during my care of the patient were reviewed by me and considered in my medical decision making (see chart for details).     Final Clinical Impressions(s) / UC Diagnoses   Final diagnoses:  Eustachian tube dysfunction, right     Discharge Instructions      I have prescribed prednisone , flonase , and sudafed for your symptoms. If they do not improve in 3-5 days we should follow-up with ENT.     ED Prescriptions     Medication Sig Dispense Auth. Provider   predniSONE  (DELTASONE ) 50 MG tablet Take 1 tab po daily for 5 days 5 tablet Andra Krabbe C, PA-C   pseudoephedrine  (SUDAFED) 30 MG tablet Take 1 tablet (30 mg total) by mouth every 4 (four) hours as needed for congestion. 30 tablet Jedaiah Rathbun C, PA-C   fluticasone  (FLONASE ) 50 MCG/ACT nasal spray Place 1 spray into both nostrils daily. 16 g Andra Krabbe BROCKS, PA-C      PDMP not reviewed this encounter.    [1]  Social History Tobacco Use   Smoking status: Never   Smokeless tobacco: Never  Vaping Use   Vaping status: Never Used  Substance Use Topics   Alcohol use: Not Currently   Drug use: Never     Andra Krabbe BROCKS, PA-C 02/13/24 [id number]  "

## 2024-02-13 NOTE — ED Triage Notes (Signed)
 Pt right ear pain for over a week, worse since Sunday. Unbearable. She took tylenol last night with some relief. States pain was radiating into her jaw last night

## 2024-05-19 ENCOUNTER — Ambulatory Visit: Payer: Self-pay | Admitting: Nurse Practitioner
